# Patient Record
Sex: Female | Born: 1985 | Race: White | Hispanic: No | Marital: Married | Smoking: Never smoker
Health system: Southern US, Community
[De-identification: ages and names within clinical notes are randomized; demographics above are authoritative.]

## PROBLEM LIST (undated history)

## (undated) ENCOUNTER — Inpatient Hospital Stay (HOSPITAL_COMMUNITY): Payer: Self-pay

## (undated) DIAGNOSIS — N390 Urinary tract infection, site not specified: Secondary | ICD-10-CM

## (undated) DIAGNOSIS — O26899 Other specified pregnancy related conditions, unspecified trimester: Secondary | ICD-10-CM

## (undated) DIAGNOSIS — R12 Heartburn: Secondary | ICD-10-CM

## (undated) DIAGNOSIS — B373 Candidiasis of vulva and vagina: Secondary | ICD-10-CM

## (undated) DIAGNOSIS — B3731 Acute candidiasis of vulva and vagina: Secondary | ICD-10-CM

## (undated) DIAGNOSIS — Z789 Other specified health status: Secondary | ICD-10-CM

## (undated) HISTORY — PX: NO PAST SURGERIES: SHX2092

## (undated) HISTORY — DX: Acute candidiasis of vulva and vagina: B37.31

## (undated) HISTORY — DX: Urinary tract infection, site not specified: N39.0

## (undated) HISTORY — DX: Candidiasis of vulva and vagina: B37.3

## (undated) HISTORY — PX: WISDOM TOOTH EXTRACTION: SHX21

---

## 2011-04-28 ENCOUNTER — Encounter: Payer: Self-pay | Admitting: *Deleted

## 2011-04-28 ENCOUNTER — Ambulatory Visit (INDEPENDENT_AMBULATORY_CARE_PROVIDER_SITE_OTHER): Payer: BC Managed Care – PPO | Admitting: *Deleted

## 2011-04-28 VITALS — BP 134/73 | Temp 98.6°F | Ht 71.0 in | Wt 180.0 lb

## 2011-04-28 DIAGNOSIS — Z3480 Encounter for supervision of other normal pregnancy, unspecified trimester: Secondary | ICD-10-CM

## 2011-04-28 NOTE — Progress Notes (Signed)
Pt's IUP today showed CRL 5.31mm GA [redacted]w[redacted]d FHT 123bpm

## 2011-04-28 NOTE — Progress Notes (Signed)
p-72  This is a NOB intake for this G3P1A1.  She is the sister in law of Brent General who delivered with [place] 3 years ago.  Her last LMP is 4 weeks off from the U/S today.  She did say that she had one day of spotting in Feb which would correlate with the u/s.  PN labs drawn today and pt will return in 2 weeks for exam.  Her last pap was 6/12.  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

## 2011-04-29 LAB — OBSTETRIC PANEL
Basophils Absolute: 0 10*3/uL (ref 0.0–0.1)
Basophils Relative: 0 % (ref 0–1)
Eosinophils Absolute: 0.2 10*3/uL (ref 0.0–0.7)
Eosinophils Relative: 1 % (ref 0–5)
Hepatitis B Surface Ag: NEGATIVE
MCH: 30.1 pg (ref 26.0–34.0)
MCHC: 33.4 g/dL (ref 30.0–36.0)
Neutrophils Relative %: 66 % (ref 43–77)
Platelets: 183 10*3/uL (ref 150–400)
RBC: 4.45 MIL/uL (ref 3.87–5.11)
RDW: 12.9 % (ref 11.5–15.5)

## 2011-04-29 LAB — HIV ANTIBODY (ROUTINE TESTING W REFLEX): HIV: NONREACTIVE

## 2011-05-01 LAB — CULTURE, URINE COMPREHENSIVE: Colony Count: 1000

## 2011-05-12 ENCOUNTER — Ambulatory Visit (INDEPENDENT_AMBULATORY_CARE_PROVIDER_SITE_OTHER): Payer: BC Managed Care – PPO | Admitting: Obstetrics and Gynecology

## 2011-05-12 ENCOUNTER — Encounter: Payer: Self-pay | Admitting: Obstetrics and Gynecology

## 2011-05-12 ENCOUNTER — Other Ambulatory Visit: Payer: Self-pay | Admitting: Obstetrics and Gynecology

## 2011-05-12 VITALS — BP 121/78 | Temp 98.6°F | Wt 180.0 lb

## 2011-05-12 DIAGNOSIS — O219 Vomiting of pregnancy, unspecified: Secondary | ICD-10-CM

## 2011-05-12 DIAGNOSIS — Z124 Encounter for screening for malignant neoplasm of cervix: Secondary | ICD-10-CM

## 2011-05-12 DIAGNOSIS — Z113 Encounter for screening for infections with a predominantly sexual mode of transmission: Secondary | ICD-10-CM

## 2011-05-12 DIAGNOSIS — Z3480 Encounter for supervision of other normal pregnancy, unspecified trimester: Secondary | ICD-10-CM | POA: Insufficient documentation

## 2011-05-12 DIAGNOSIS — Z1151 Encounter for screening for human papillomavirus (HPV): Secondary | ICD-10-CM

## 2011-05-12 MED ORDER — ONDANSETRON 4 MG PO TBDP: 4.0000 mg | ORAL_TABLET | Freq: Three times a day (TID) | ORAL | Status: AC | PRN

## 2011-05-12 NOTE — Progress Notes (Signed)
p-68  Pt is requesting something for nausea

## 2011-05-12 NOTE — Patient Instructions (Signed)
Pregnancy - First Trimester  During sexual intercourse, millions of sperm go into the vagina. Only 1 sperm will penetrate and fertilize the female egg while it is in the Fallopian tube. One week later, the fertilized egg implants into the wall of the uterus. An embryo begins to develop into a baby. At 6 to 8 weeks, the eyes and face are formed and the heartbeat can be seen on ultrasound. At the end of 12 weeks (first trimester), all the baby's organs are formed. Now that you are pregnant, you will want to do everything you can to have a healthy baby. Two of the most important things are to get good prenatal care and follow your caregiver's instructions. Prenatal care is all the medical care you receive before the baby's birth. It is given to prevent, find, and treat problems during the pregnancy and childbirth.  PRENATAL EXAMS   During prenatal visits, your weight, blood pressure and urine are checked. This is done to make sure you are healthy and progressing normally during the pregnancy.   A pregnant woman should gain 25 to 35 pounds during the pregnancy. However, if you are over weight or underweight, your caregiver will advise you regarding your weight.   Your caregiver will ask and answer questions for you.   Blood work, cervical cultures, other necessary tests and a Pap test are done during your prenatal exams. These tests are done to check on your health and the probable health of your baby. Tests are strongly recommended and done for HIV with your permission. This is the virus that causes AIDS. These tests are done because medications can be given to help prevent your baby from being born with this infection should you have been infected without knowing it. Blood work is also used to find out your blood type, previous infections and follow your blood levels (hemoglobin).   Low hemoglobin (anemia) is common during pregnancy. Iron and vitamins are given to help prevent this. Later in the pregnancy, blood  tests for diabetes will be done along with any other tests if any problems develop. You may need tests to make sure you and the baby are doing well.   You may need other tests to make sure you and the baby are doing well.  CHANGES DURING THE FIRST TRIMESTER (THE FIRST 3 MONTHS OF PREGNANCY)  Your body goes through many changes during pregnancy. They vary from person to person. Talk to your caregiver about changes you notice and are concerned about. Changes can include:   Your menstrual period stops.   The egg and sperm carry the genes that determine what you look like. Genes from you and your partner are forming a baby. The female genes determine whether the baby is a boy or a girl.   Your body increases in girth and you may feel bloated.   Feeling sick to your stomach (nauseous) and throwing up (vomiting). If the vomiting is uncontrollable, call your caregiver.   Your breasts will begin to enlarge and become tender.   Your nipples may stick out more and become darker.   The need to urinate more. Painful urination may mean you have a bladder infection.   Tiring easily.   Loss of appetite.   Cravings for certain kinds of food.   At first, you may gain or lose a couple of pounds.   You may have changes in your emotions from day to day (excited to be pregnant or concerned something may go wrong with   the pregnancy and baby).   You may have more vivid and strange dreams.  HOME CARE INSTRUCTIONS    It is very important to avoid all smoking, alcohol and un-prescribed drugs during your pregnancy. These affect the formation and growth of the baby. Avoid chemicals while pregnant to ensure the delivery of a healthy infant.   Start your prenatal visits by the 12th week of pregnancy. They are usually scheduled monthly at first, then more often in the last 2 months before delivery. Keep your caregiver's appointments. Follow your caregiver's instructions regarding medication use, blood and lab tests, exercise, and  diet.   During pregnancy, you are providing food for you and your baby. Eat regular, well-balanced meals. Choose foods such as meat, fish, milk and other low fat dairy products, vegetables, fruits, and whole-grain breads and cereals. Your caregiver will tell you of the ideal weight gain.   You can help morning sickness by keeping soda crackers at the bedside. Eat a couple before arising in the morning. You may want to use the crackers without salt on them.   Eating 4 to 5 small meals rather than 3 large meals a day also may help the nausea and vomiting.   Drinking liquids between meals instead of during meals also seems to help nausea and vomiting.   A physical sexual relationship may be continued throughout pregnancy if there are no other problems. Problems may be early (premature) leaking of amniotic fluid from the membranes, vaginal bleeding, or belly (abdominal) pain.   Exercise regularly if there are no restrictions. Check with your caregiver or physical therapist if you are unsure of the safety of some of your exercises. Greater weight gain will occur in the last 2 trimesters of pregnancy. Exercising will help:   Control your weight.   Keep you in shape.   Prepare you for labor and delivery.   Help you lose your pregnancy weight after you deliver your baby.   Wear a good support or jogging bra for breast tenderness during pregnancy. This may help if worn during sleep too.   Ask when prenatal classes are available. Begin classes when they are offered.   Do not use hot tubs, steam rooms or saunas.   Wear your seat belt when driving. This protects you and your baby if you are in an accident.   Avoid raw meat, uncooked cheese, cat litter boxes and soil used by cats throughout the pregnancy. These carry germs that can cause birth defects in the baby.   The first trimester is a good time to visit your dentist for your dental health. Getting your teeth cleaned is OK. Use a softer toothbrush and brush  gently during pregnancy.   Ask for help if you have financial, counseling or nutritional needs during pregnancy. Your caregiver will be able to offer counseling for these needs as well as refer you for other special needs.   Do not take any medications or herbs unless told by your caregiver.   Inform your caregiver if there is any mental or physical domestic violence.   Make a list of emergency phone numbers of family, friends, hospital, and police and fire departments.   Write down your questions. Take them to your prenatal visit.   Do not douche.   Do not cross your legs.   If you have to stand for long periods of time, rotate you feet or take small steps in a circle.   You may have more vaginal secretions that may   require a sanitary pad. Do not use tampons or scented sanitary pads.  MEDICATIONS AND DRUG USE IN PREGNANCY   Take prenatal vitamins as directed. The vitamin should contain 1 milligram of folic acid. Keep all vitamins out of reach of children. Only a couple vitamins or tablets containing iron may be fatal to a baby or young child when ingested.   Avoid use of all medications, including herbs, over-the-counter medications, not prescribed or suggested by your caregiver. Only take over-the-counter or prescription medicines for pain, discomfort, or fever as directed by your caregiver. Do not use aspirin, ibuprofen, or naproxen unless directed by your caregiver.   Let your caregiver also know about herbs you may be using.   Alcohol is related to a number of birth defects. This includes fetal alcohol syndrome. All alcohol, in any form, should be avoided completely. Smoking will cause low birth rate and premature babies.   Street or illegal drugs are very harmful to the baby. They are absolutely forbidden. A baby born to an addicted mother will be addicted at birth. The baby will go through the same withdrawal an adult does.   Let your caregiver know about any medications that you have to take  and for what reason you take them.  MISCARRIAGE IS COMMON DURING PREGNANCY  A miscarriage does not mean you did something wrong. It is not a reason to worry about getting pregnant again. Your caregiver will help you with questions you may have. If you have a miscarriage, you may need minor surgery.  SEEK MEDICAL CARE IF:   You have any concerns or worries during your pregnancy. It is better to call with your questions if you feel they cannot wait, rather than worry about them.  SEEK IMMEDIATE MEDICAL CARE IF:    An unexplained oral temperature above 102 F (38.9 C) develops, or as your caregiver suggests.   You have leaking of fluid from the vagina (birth canal). If leaking membranes are suspected, take your temperature and inform your caregiver of this when you call.   There is vaginal spotting or bleeding. Notify your caregiver of the amount and how many pads are used.   You develop a bad smelling vaginal discharge with a change in the color.   You continue to feel sick to your stomach (nauseated) and have no relief from remedies suggested. You vomit blood or coffee ground-like materials.   You lose more than 2 pounds of weight in 1 week.   You gain more than 2 pounds of weight in 1 week and you notice swelling of your face, hands, feet, or legs.   You gain 5 pounds or more in 1 week (even if you do not have swelling of your hands, face, legs, or feet).   You get exposed to German measles and have never had them.   You are exposed to fifth disease or chickenpox.   You develop belly (abdominal) pain. Round ligament discomfort is a common non-cancerous (benign) cause of abdominal pain in pregnancy. Your caregiver still must evaluate this.   You develop headache, fever, diarrhea, pain with urination, or shortness of breath.   You fall or are in a car accident or have any kind of trauma.   There is mental or physical violence in your home.  Document Released: 12/24/2000 Document Revised: 12/19/2010  Document Reviewed: 06/27/2008  [employer] Patient Information 2012 [employer]

## 2011-05-14 NOTE — Progress Notes (Signed)
   Subjective:    Angie Green is a G3P1011 [redacted]w[redacted]d being seen today for her first obstetrical visit.  Her obstetrical history is significant for no complications. Patient does intend to breast feed. Pregnancy history fully reviewed.  Patient reports nausea and vomiting.  Filed Vitals:   05/12/11 0954  BP: 121/78  Temp: 98.6 F (37 C)  Weight: 180 lb (81.647 kg)    HISTORY: OB History    Grav Para Term Preterm Abortions TAB SAB Ect Mult Living   3 1 1  1  1   1      # Outc Date GA Lbr Len/2nd Wgt Sex Del Anes PTL Lv   1 SAB 3/06           2 TRM 5/07 [redacted]w[redacted]d   M SVD EPI     3 CUR              Past Medical History  Diagnosis Date  . Vaginal yeast infection     recurrent  . UTI (lower urinary tract infection)     recurrent  . PONV (postoperative nausea and vomiting)    Past Surgical History  Procedure Date  . Wisdom tooth extraction    Family History  Problem Relation Age of Onset  . Diabetes Maternal Grandfather   . Diabetes Paternal Grandmother   . Cancer Father     leukemia  . Cancer Father     rectal  . Cancer Maternal Grandfather     bladder  . Heart attack Maternal Grandfather      Exam    Uterus:     Pelvic Exam:    Perineum: No Hemorrhoids   Vulva: normal, Bartholin's, Urethra, Skene's normal   Vagina:  normal mucosa, normal discharge       Cervix: multiparous appearance   Adnexa: normal adnexa   Bony Pelvis: average  System: Breast:  Inspection negative   Skin: normal coloration and turgor, no rashes    Neurologic: oriented, normal   Extremities: normal strength, tone, and muscle mass   HEENT PERRLA   Mouth/Teeth mucous membranes moist, pharynx normal without lesions and dental hygiene good   Neck supple and no thyromegaly   Cardiovascular: regular rate and rhythm   Respiratory:  appears well, vitals normal, no respiratory distress, acyanotic, normal RR, ear and throat exam is normal, neck free of mass or lymphadenopathy, chest clear, no  wheezing, crepitations, rhonchi, normal symmetric air entry   Abdomen: soft, nontender   Urinary: urethral meatus normal      Assessment:    Pregnancy: [id number] Patient Active Problem List  Diagnoses  . Supervision of normal subsequent pregnancy    nausea/vomiting of pregnancy    Plan:     Initial labs reviewed  Pap, GC, Chlamydia sent Prenatal vitamins. Rx Zofran Problem list reviewed and updated. Genetic Screening discussed Integrated Screen: undecided.  Ultrasound discussed; fetal survey: requested.  Follow up in 4 weeks. 50% of 30 min visit spent on counseling and coordination of care.     Marlene Pfluger 05/14/2011

## 2011-06-04 ENCOUNTER — Encounter: Payer: Self-pay | Admitting: Obstetrics & Gynecology

## 2011-06-04 ENCOUNTER — Ambulatory Visit (INDEPENDENT_AMBULATORY_CARE_PROVIDER_SITE_OTHER): Payer: BC Managed Care – PPO | Admitting: Obstetrics & Gynecology

## 2011-06-04 VITALS — BP 127/69 | Temp 97.2°F | Wt 181.0 lb

## 2011-06-04 DIAGNOSIS — Z3480 Encounter for supervision of other normal pregnancy, unspecified trimester: Secondary | ICD-10-CM

## 2011-06-04 DIAGNOSIS — R5383 Other fatigue: Secondary | ICD-10-CM

## 2011-06-04 DIAGNOSIS — R5381 Other malaise: Secondary | ICD-10-CM

## 2011-06-04 LAB — CBC
MCH: 30.3 pg (ref 26.0–34.0)
MCHC: 33.7 g/dL (ref 30.0–36.0)
MCV: 89.9 fL (ref 78.0–100.0)
Platelets: 152 10*3/uL (ref 150–400)

## 2011-06-04 NOTE — Progress Notes (Signed)
Work in visit. Complains of SOB/fatigue/palpitations yesterday, improved some today. Denies other symptoms. Heart- rrr without m,r,g, Lungs- CTAB, pulse 70. I will check a TSH and CBC today. If her symptoms are still present at there scheduled office visit next Friday, I would recommend a cardiology referral. She is scheduled to get her First Trimester screen this week with [facility].

## 2011-06-04 NOTE — Progress Notes (Signed)
p-75  Episodes of SOB and heart palpitations..  Extreme fatigue  Going out of town and just wanted to be checked.  States it feels like asthma but denies any h/o

## 2011-06-06 ENCOUNTER — Other Ambulatory Visit (HOSPITAL_COMMUNITY): Payer: BC Managed Care – PPO

## 2011-06-10 ENCOUNTER — Ambulatory Visit (HOSPITAL_COMMUNITY)
Admission: RE | Admit: 2011-06-10 | Discharge: 2011-06-10 | Disposition: A | Discharge status: Home / Self Care | Payer: BC Managed Care – PPO | Source: Ambulatory Visit | Attending: Obstetrics and Gynecology | Admitting: Obstetrics and Gynecology

## 2011-06-10 VITALS — BP 121/62 | HR 60 | Wt 182.0 lb

## 2011-06-10 DIAGNOSIS — Z3689 Encounter for other specified antenatal screening: Secondary | ICD-10-CM | POA: Insufficient documentation

## 2011-06-10 DIAGNOSIS — Z3480 Encounter for supervision of other normal pregnancy, unspecified trimester: Secondary | ICD-10-CM

## 2011-06-10 DIAGNOSIS — O351XX0 Maternal care for (suspected) chromosomal abnormality in fetus, not applicable or unspecified: Secondary | ICD-10-CM | POA: Insufficient documentation

## 2011-06-10 DIAGNOSIS — O219 Vomiting of pregnancy, unspecified: Secondary | ICD-10-CM

## 2011-06-10 DIAGNOSIS — O3510X0 Maternal care for (suspected) chromosomal abnormality in fetus, unspecified, not applicable or unspecified: Secondary | ICD-10-CM | POA: Insufficient documentation

## 2011-06-10 NOTE — Progress Notes (Signed)
Patient seen today  For ultrasound.  See full report in [facility].  Alpha Gula, MD  Single IUP at 12 4/7 weeks First trimester screen performed - an NT of 1.2 mm was noted A nasal bone was appreciated  Recommend anatomy scan at [redacted] weeks gestation which can be scheduled here if desired.  Please contact us if we can be of further assistance.

## 2011-06-11 ENCOUNTER — Encounter: Payer: BC Managed Care – PPO | Admitting: Obstetrics & Gynecology

## 2011-06-12 ENCOUNTER — Other Ambulatory Visit: Payer: Self-pay

## 2011-06-13 ENCOUNTER — Encounter: Payer: Self-pay | Admitting: Family

## 2011-06-13 ENCOUNTER — Ambulatory Visit (INDEPENDENT_AMBULATORY_CARE_PROVIDER_SITE_OTHER): Payer: BC Managed Care – PPO | Admitting: Family

## 2011-06-13 VITALS — BP 109/69 | Temp 98.2°F | Wt 180.0 lb

## 2011-06-13 DIAGNOSIS — Z3480 Encounter for supervision of other normal pregnancy, unspecified trimester: Secondary | ICD-10-CM

## 2011-06-13 NOTE — Progress Notes (Signed)
p-72  Had nose bleed on Tues., has bad taste in mouth, no matter what she does to brush or floss.  Having fewer episodes of palpitations.

## 2011-06-13 NOTE — Progress Notes (Signed)
Considering water birth, discussed class; son is 6 and wants a brother; nose bleed with resolution, explained common in pregnancy; anatomy ultrasound scheduled for 07/21/11 (wedding anniversary)

## 2011-06-16 ENCOUNTER — Encounter: Payer: Self-pay | Admitting: Obstetrics & Gynecology

## 2011-06-17 ENCOUNTER — Encounter: Payer: Self-pay | Admitting: Obstetrics & Gynecology

## 2011-07-11 ENCOUNTER — Ambulatory Visit (INDEPENDENT_AMBULATORY_CARE_PROVIDER_SITE_OTHER): Payer: BC Managed Care – PPO | Admitting: Advanced Practice Midwife

## 2011-07-11 VITALS — BP 124/76 | Temp 98.4°F | Wt 184.0 lb

## 2011-07-11 DIAGNOSIS — Z3480 Encounter for supervision of other normal pregnancy, unspecified trimester: Secondary | ICD-10-CM

## 2011-07-11 DIAGNOSIS — Z3490 Encounter for supervision of normal pregnancy, unspecified, unspecified trimester: Secondary | ICD-10-CM

## 2011-07-11 NOTE — Progress Notes (Signed)
Feels well.  Scheduled for [place] next week.

## 2011-07-11 NOTE — Patient Instructions (Addendum)
Pregnancy - Second Trimester The second trimester of pregnancy (3 to 6 months) is a period of rapid growth for you and your baby. At the end of the sixth month, your baby is about 9 inches long and weighs 1 1/2 pounds. You will begin to feel the baby move between 18 and 20 weeks of the pregnancy. This is called quickening. Weight gain is faster. A clear fluid (colostrum) may leak out of your breasts. You may feel small contractions of the womb (uterus). This is known as false labor or Braxton-Hicks contractions. This is like a practice for labor when the baby is ready to be born. Usually, the problems with morning sickness have usually passed by the end of your first trimester. Some women develop small dark blotches (called cholasma, mask of pregnancy) on their face that usually goes away after the baby is born. Exposure to the sun makes the blotches worse. Acne may also develop in some pregnant women and pregnant women who have acne, may find that it goes away. PRENATAL EXAMS  Blood work may continue to be done during prenatal exams. These tests are done to check on your health and the probable health of your baby. Blood work is used to follow your blood levels (hemoglobin). Anemia (low hemoglobin) is common during pregnancy. Iron and vitamins are given to help prevent this. You will also be checked for diabetes between 24 and 28 weeks of the pregnancy. Some of the previous blood tests may be repeated.   The size of the uterus is measured during each visit. This is to make sure that the baby is continuing to grow properly according to the dates of the pregnancy.   Your blood pressure is checked every prenatal visit. This is to make sure you are not getting toxemia.   Your urine is checked to make sure you do not have an infection, diabetes or protein in the urine.   Your weight is checked often to make sure gains are happening at the suggested rate. This is to ensure that both you and your baby are  growing normally.   Sometimes, an ultrasound is performed to confirm the proper growth and development of the baby. This is a test which bounces harmless sound waves off the baby so your caregiver can more accurately determine due dates.  Sometimes, a specialized test is done on the amniotic fluid surrounding the baby. This test is called an amniocentesis. The amniotic fluid is obtained by sticking a needle into the belly (abdomen). This is done to check the chromosomes in instances where there is a concern about possible genetic problems with the baby. It is also sometimes done near the end of pregnancy if an early delivery is required. In this case, it is done to help make sure the baby's lungs are mature enough for the baby to live outside of the womb. CHANGES OCCURING IN THE SECOND TRIMESTER OF PREGNANCY Your body goes through many changes during pregnancy. They vary from person to person. Talk to your caregiver about changes you notice that you are concerned about.  During the second trimester, you will likely have an increase in your appetite. It is normal to have cravings for certain foods. This varies from person to person and pregnancy to pregnancy.   Your lower abdomen will begin to bulge.   You may have to urinate more often because the uterus and baby are pressing on your bladder. It is also common to get more bladder infections during pregnancy (  pain with urination). You can help this by drinking lots of fluids and emptying your bladder before and after intercourse.   You may begin to get stretch marks on your hips, abdomen, and breasts. These are normal changes in the body during pregnancy. There are no exercises or medications to take that prevent this change.   You may begin to develop swollen and bulging veins (varicose veins) in your legs. Wearing support hose, elevating your feet for 15 minutes, 3 to 4 times a day and limiting salt in your diet helps lessen the problem.    Heartburn may develop as the uterus grows and pushes up against the stomach. Antacids recommended by your caregiver helps with this problem. Also, eating smaller meals 4 to 5 times a day helps.   Constipation can be treated with a stool softener or adding bulk to your diet. Drinking lots of fluids, vegetables, fruits, and whole grains are helpful.   Exercising is also helpful. If you have been very active up until your pregnancy, most of these activities can be continued during your pregnancy. If you have been less active, it is helpful to start an exercise program such as walking.   Hemorrhoids (varicose veins in the rectum) may develop at the end of the second trimester. Warm sitz baths and hemorrhoid cream recommended by your caregiver helps hemorrhoid problems.   Backaches may develop during this time of your pregnancy. Avoid heavy lifting, wear low heal shoes and practice good posture to help with backache problems.   Some pregnant women develop tingling and numbness of their hand and fingers because of swelling and tightening of ligaments in the wrist (carpel tunnel syndrome). This goes away after the baby is born.   As your breasts enlarge, you may have to get a bigger bra. Get a comfortable, cotton, support bra. Do not get a nursing bra until the last month of the pregnancy if you will be nursing the baby.   You may get a dark line from your belly button to the pubic area called the linea nigra.   You may develop rosy cheeks because of increase blood flow to the face.   You may develop spider looking lines of the face, neck, arms and chest. These go away after the baby is born.  HOME CARE INSTRUCTIONS   It is extremely important to avoid all smoking, herbs, alcohol, and unprescribed drugs during your pregnancy. These chemicals affect the formation and growth of the baby. Avoid these chemicals throughout the pregnancy to ensure the delivery of a healthy infant.   Most of your home  care instructions are the same as suggested for the first trimester of your pregnancy. Keep your caregiver's appointments. Follow your caregiver's instructions regarding medication use, exercise and diet.   During pregnancy, you are providing food for you and your baby. Continue to eat regular, well-balanced meals. Choose foods such as meat, fish, milk and other low fat dairy products, vegetables, fruits, and whole-grain breads and cereals. Your caregiver will tell you of the ideal weight gain.   A physical sexual relationship may be continued up until near the end of pregnancy if there are no other problems. Problems could include early (premature) leaking of amniotic fluid from the membranes, vaginal bleeding, abdominal pain, or other medical or pregnancy problems.   Exercise regularly if there are no restrictions. Check with your caregiver if you are unsure of the safety of some of your exercises. The greatest weight gain will occur in the   last 2 trimesters of pregnancy. Exercise will help you:   Control your weight.   Get you in shape for labor and delivery.   Lose weight after you have the baby.   Wear a good support or jogging bra for breast tenderness during pregnancy. This may help if worn during sleep. Pads or tissues may be used in the bra if you are leaking colostrum.   Do not use hot tubs, steam rooms or saunas throughout the pregnancy.   Wear your seat belt at all times when driving. This protects you and your baby if you are in an accident.   Avoid raw meat, uncooked cheese, cat litter boxes and soil used by cats. These carry germs that can cause birth defects in the baby.   The second trimester is also a good time to visit your dentist for your dental health if this has not been done yet. Getting your teeth cleaned is OK. Use a soft toothbrush. Brush gently during pregnancy.   It is easier to loose urine during pregnancy. Tightening up and strengthening the pelvic muscles will  help with this problem. Practice stopping your urination while you are going to the bathroom. These are the same muscles you need to strengthen. It is also the muscles you would use as if you were trying to stop from passing gas. You can practice tightening these muscles up 10 times a set and repeating this about 3 times per day. Once you know what muscles to tighten up, do not perform these exercises during urination. It is more likely to contribute to an infection by backing up the urine.   Ask for help if you have financial, counseling or nutritional needs during pregnancy. Your caregiver will be able to offer counseling for these needs as well as refer you for other special needs.   Your skin may become oily. If so, wash your face with mild soap, use non-greasy moisturizer and oil or cream based makeup.  MEDICATIONS AND DRUG USE IN PREGNANCY  Take prenatal vitamins as directed. The vitamin should contain 1 milligram of folic acid. Keep all vitamins out of reach of children. Only a couple vitamins or tablets containing iron may be fatal to a baby or young child when ingested.   Avoid use of all medications, including herbs, over-the-counter medications, not prescribed or suggested by your caregiver. Only take over-the-counter or prescription medicines for pain, discomfort, or fever as directed by your caregiver. Do not use aspirin.   Let your caregiver also know about herbs you may be using.   Alcohol is related to a number of birth defects. This includes fetal alcohol syndrome. All alcohol, in any form, should be avoided completely. Smoking will cause low birth rate and premature babies.   Street or illegal drugs are very harmful to the baby. They are absolutely forbidden. A baby born to an addicted mother will be addicted at birth. The baby will go through the same withdrawal an adult does.  SEEK MEDICAL CARE IF:  You have any concerns or worries during your pregnancy. It is better to call with  your questions if you feel they cannot wait, rather than worry about them. SEEK IMMEDIATE MEDICAL CARE IF:   An unexplained oral temperature above 102 F (38.9 C) develops, or as your caregiver suggests.   You have leaking of fluid from the vagina (birth canal). If leaking membranes are suspected, take your temperature and tell your caregiver of this when you call.   There   is vaginal spotting, bleeding, or passing clots. Tell your caregiver of the amount and how many pads are used. Light spotting in pregnancy is common, especially following intercourse.   You develop a bad smelling vaginal discharge with a change in the color from clear to white.   You continue to feel sick to your stomach (nauseated) and have no relief from remedies suggested. You vomit blood or coffee ground-like materials.   You lose more than 2 pounds of weight or gain more than 2 pounds of weight over 1 week, or as suggested by your caregiver.   You notice swelling of your face, hands, feet, or legs.   You get exposed to German measles and have never had them.   You are exposed to fifth disease or chickenpox.   You develop belly (abdominal) pain. Round ligament discomfort is a common non-cancerous (benign) cause of abdominal pain in pregnancy. Your caregiver still must evaluate you.   You develop a bad headache that does not go away.   You develop fever, diarrhea, pain with urination, or shortness of breath.   You develop visual problems, blurry, or double vision.   You fall or are in a car accident or any kind of trauma.   There is mental or physical violence at home.  Document Released: 12/24/2000 Document Revised: 12/19/2010 Document Reviewed: 06/28/2008 [employer] Patient Information 2012 [employer]. 

## 2011-07-11 NOTE — Progress Notes (Signed)
p-77 

## 2011-07-15 ENCOUNTER — Encounter: Payer: Self-pay | Admitting: Advanced Practice Midwife

## 2011-07-21 ENCOUNTER — Other Ambulatory Visit (HOSPITAL_COMMUNITY): Payer: BC Managed Care – PPO

## 2011-07-21 ENCOUNTER — Ambulatory Visit (HOSPITAL_COMMUNITY)
Admission: RE | Admit: 2011-07-21 | Discharge: 2011-07-21 | Disposition: A | Discharge status: Home / Self Care | Payer: BC Managed Care – PPO | Source: Ambulatory Visit | Attending: Obstetrics and Gynecology | Admitting: Obstetrics and Gynecology

## 2011-07-21 VITALS — BP 112/55 | HR 61 | Wt 187.2 lb

## 2011-07-21 DIAGNOSIS — Z1389 Encounter for screening for other disorder: Secondary | ICD-10-CM | POA: Insufficient documentation

## 2011-07-21 DIAGNOSIS — Z363 Encounter for antenatal screening for malformations: Secondary | ICD-10-CM | POA: Insufficient documentation

## 2011-07-21 DIAGNOSIS — O4400 Placenta previa specified as without hemorrhage, unspecified trimester: Secondary | ICD-10-CM

## 2011-07-21 DIAGNOSIS — Z3480 Encounter for supervision of other normal pregnancy, unspecified trimester: Secondary | ICD-10-CM

## 2011-07-21 DIAGNOSIS — O358XX0 Maternal care for other (suspected) fetal abnormality and damage, not applicable or unspecified: Secondary | ICD-10-CM | POA: Insufficient documentation

## 2011-07-21 NOTE — Progress Notes (Signed)
Patient seen today  for ultrasound a[[pointment.  See full report in [facility].  Alpha Gula, MD  Single IUP at 18 3/7 weeks Normal detailed fetal anatomy No markers associated with aneuploidy noted A posterior placenta previa is appreciated Normal amniotic fluid volume  Recommend follow up ultrasound in 6 weeks to reevaluate placental location

## 2011-07-29 ENCOUNTER — Ambulatory Visit (INDEPENDENT_AMBULATORY_CARE_PROVIDER_SITE_OTHER): Payer: BC Managed Care – PPO | Admitting: Obstetrics & Gynecology

## 2011-07-29 VITALS — BP 130/71 | Temp 98.6°F | Wt 187.0 lb

## 2011-07-29 DIAGNOSIS — O0990 Supervision of high risk pregnancy, unspecified, unspecified trimester: Secondary | ICD-10-CM

## 2011-07-29 DIAGNOSIS — Z3480 Encounter for supervision of other normal pregnancy, unspecified trimester: Secondary | ICD-10-CM

## 2011-07-29 NOTE — Progress Notes (Signed)
Pt having pressure over pubic symphysis and in inguinal regions.  Pt has posterior placenta previa.  No bleeding or contractions.  No mass in inguinal region.  Urine clear.  Some tenderness over pubic symphysis.  Pt reassured.  No evidence of preterm labor.  Speculum exam shows nml cervix.  Bedside US shows long cervix and posterior previa.

## 2011-07-29 NOTE — Progress Notes (Signed)
p-71  White vag d/c but denies any itching or burning.

## 2011-07-30 ENCOUNTER — Encounter (HOSPITAL_COMMUNITY): Payer: Self-pay | Admitting: *Deleted

## 2011-07-30 ENCOUNTER — Inpatient Hospital Stay (HOSPITAL_COMMUNITY)
Admission: AD | Admit: 2011-07-30 | Discharge: 2011-07-30 | Disposition: A | Discharge status: Home / Self Care | Payer: BC Managed Care – PPO | Source: Ambulatory Visit | Attending: Obstetrics & Gynecology | Admitting: Obstetrics & Gynecology

## 2011-07-30 DIAGNOSIS — O4400 Placenta previa specified as without hemorrhage, unspecified trimester: Secondary | ICD-10-CM

## 2011-07-30 DIAGNOSIS — O4692 Antepartum hemorrhage, unspecified, second trimester: Secondary | ICD-10-CM

## 2011-07-30 DIAGNOSIS — O26899 Other specified pregnancy related conditions, unspecified trimester: Secondary | ICD-10-CM

## 2011-07-30 DIAGNOSIS — R102 Pelvic and perineal pain: Secondary | ICD-10-CM

## 2011-07-30 DIAGNOSIS — O4410 Placenta previa with hemorrhage, unspecified trimester: Secondary | ICD-10-CM | POA: Insufficient documentation

## 2011-07-30 DIAGNOSIS — D696 Thrombocytopenia, unspecified: Secondary | ICD-10-CM | POA: Insufficient documentation

## 2011-07-30 DIAGNOSIS — D689 Coagulation defect, unspecified: Secondary | ICD-10-CM | POA: Insufficient documentation

## 2011-07-30 DIAGNOSIS — O209 Hemorrhage in early pregnancy, unspecified: Secondary | ICD-10-CM | POA: Insufficient documentation

## 2011-07-30 LAB — CBC
HCT: 33.8 % — ABNORMAL LOW (ref 36.0–46.0)
Hemoglobin: 11.6 g/dL — ABNORMAL LOW (ref 12.0–15.0)
MCV: 89.4 fL (ref 78.0–100.0)
RBC: 3.78 MIL/uL — ABNORMAL LOW (ref 3.87–5.11)
RDW: 12.9 % (ref 11.5–15.5)
WBC: 9.9 10*3/uL (ref 4.0–10.5)

## 2011-07-30 LAB — ABO/RH: ABO/RH(D): O POS

## 2011-07-30 LAB — TYPE AND SCREEN: Antibody Screen: NEGATIVE

## 2011-07-30 NOTE — MAU Note (Signed)
Pt states she had some vaginal bleeding that started about 0130 . Pt states she had a vaginal exam at the [facility] in [place]

## 2011-07-30 NOTE — MAU Provider Note (Signed)
Angie Green y.o.G3P1011 @[redacted]w[redacted]d  by LMP Chief Complaint  Patient presents with  . Vaginal Bleeding     First Provider Initiated Contact with Patient 07/30/11 0246      SUBJECTIVE  HPI: Angie Green is a 26 y.o. year old G97P1011 female at [redacted]w[redacted]d weeks gestation who presents to MAU reporting vaginal bleeding since 0130. Know posterior previa. Describes bleeding as bright red w/ one ~4 cm clot, heavy enough to soak through her pajama pants and trickle down her legs as she walked to the bathroom. No active bleeding. Was seen in office yesterday by Dr. Penne Lash. Reported low abd/SP discomfort. Spec exam and trans-vag [place] per formed. Cervix long and closed. Denies contractions, LOF. No previous episodes of bleeding this pregnancy.   Past Medical History  Diagnosis Date  . Vaginal yeast infection     recurrent  . UTI (lower urinary tract infection)     recurrent   Past Surgical History  Procedure Date  . Wisdom tooth extraction    History   Social History  . Marital Status: Married    Spouse Name: N/A    Number of Children: N/A  . Years of Education: N/A   Occupational History  . adminstrative assist    Social History Main Topics  . Smoking status: Never Smoker   . Smokeless tobacco: Never Used  . Alcohol Use: Yes     very occassional  . Drug Use: No  . Sexually Active: Yes -- Female partner(s)   Other Topics Concern  . Not on file   Social History Narrative  . No narrative on file   No current facility-administered medications on file prior to encounter.   Current Outpatient Prescriptions on File Prior to Encounter  Medication Sig Dispense Refill  . ondansetron (ZOFRAN-ODT) 4 MG disintegrating tablet       . PRENATAL VITAMINS PO Take by mouth.       Allergies  Allergen Reactions  . Penicillins Rash    ROS: Pertinent items in HPI  OBJECTIVE BP 131/66  Pulse 76  Temp 98 F (36.7 C) (Oral)  LMP 02/12/2011  GENERAL: Well-developed, well-nourished female in  no acute distress. Anxious.  HEENT: Normocephalic, good dentition HEART: normal rate RESP: normal effort ABDOMEN: Soft, nontender EXTREMITIES: Nontender, no edema NEURO: Alert and oriented SPECULUM EXAM: deferred BIMANUAL: Deferred Scant BRB on pad. Dried blood on perineum.  FHR: 148 Toco: none  LAB RESULTS  Results for orders placed during the hospital encounter of 07/30/11 (from the past 24 hour(s))  CBC     Status: Abnormal   Collection Time   07/30/11  3:15 AM      Component Value Range   WBC 9.9  4.0 - 10.5 K/uL   RBC 3.78 (*) 3.87 - 5.11 MIL/uL   Hemoglobin 11.6 (*) 12.0 - 15.0 g/dL   HCT 45.4 (*) 09.8 - 11.9 %   MCV 89.4  78.0 - 100.0 fL   MCH 30.7  26.0 - 34.0 pg   MCHC 34.3  30.0 - 36.0 g/dL   RDW 14.7  82.9 - 56.2 %   Platelets 133 (*) 150 - 400 K/uL  TYPE AND SCREEN     Status: Normal   Collection Time   07/30/11  3:15 AM      Component Value Range   ABO/RH(D) O POS     Antibody Screen NEG     Sample Expiration 08/02/2011    ABO/RH     Status: Normal   Collection Time  07/30/11  3:15 AM      Component Value Range   ABO/RH(D) O POS      IMAGING NA  ED COURSE 0320: CBC, T&S, Obs x 2 hours. D/C home if no new bleeding. Consulted w/ Dr. Debroah Loop. Agrees w/ POC. 0500: Scant bleeding throughout MAU stay. Pt up to bathroom twice. Questions about intermittent sharp LLQ pains. None now. Mild tenderness. Denies constipation. No UC's per toco. Likely round ligament pains.   ASSESSMENT 1. Placenta previa antepartum   2. Thrombocytopenia   3. Second trimester bleeding   4. Pain of round ligament complicating pregnancy, antepartum    PLAN D/C home Bleeding precautions Stay out of work x 2 days Continue pelvic rest Medication List  As of 07/30/2011  5:12 AM   TAKE these medications         ondansetron 4 MG disintegrating tablet   Commonly known as: ZOFRAN-ODT      PRENATAL VITAMINS PO   Take by mouth.           Follow-up Information    Follow up with  [facility]. (as scheduled)    Contact information:   [address] [place] [address]       Follow up with [facility]. (As needed if symptoms worsen)    Contact information:   [address] [place] [address] [phone]        Dorathy Kinsman 07/30/2011 5:12 AM

## 2011-08-04 ENCOUNTER — Telehealth: Payer: Self-pay | Admitting: *Deleted

## 2011-08-04 NOTE — Telephone Encounter (Signed)
Pt notified of neg AFP.

## 2011-08-05 ENCOUNTER — Encounter: Payer: Self-pay | Admitting: Obstetrics & Gynecology

## 2011-08-07 ENCOUNTER — Encounter: Payer: Self-pay | Admitting: Obstetrics & Gynecology

## 2011-08-07 ENCOUNTER — Ambulatory Visit (INDEPENDENT_AMBULATORY_CARE_PROVIDER_SITE_OTHER): Payer: BC Managed Care – PPO | Admitting: Obstetrics & Gynecology

## 2011-08-07 VITALS — BP 121/71 | Wt 189.0 lb

## 2011-08-07 DIAGNOSIS — Z3480 Encounter for supervision of other normal pregnancy, unspecified trimester: Secondary | ICD-10-CM

## 2011-08-07 DIAGNOSIS — O4400 Placenta previa specified as without hemorrhage, unspecified trimester: Secondary | ICD-10-CM

## 2011-08-07 DIAGNOSIS — D696 Thrombocytopenia, unspecified: Secondary | ICD-10-CM

## 2011-08-07 NOTE — Progress Notes (Signed)
Follow visit. After her visit here last week she ended up having vaginal bleeding and spent several hours in the MAU. She was sent home and the blood became browner over the next day or so. She had an occasion of seeing blood tinged mucous several days ago. She is still on pelvic rest. She does complain of what sounds like Braxton-Hicks ctxs, so I will put her on the monitor. No contractions were seen.  I have given bleeding/pain precautions. Because her platelet count was 133K at her MAU visit, I will recheck them today. I used a small speculum and was unable to visualize the cervix but I saw no blood.

## 2011-08-07 NOTE — Progress Notes (Signed)
X 3 days having increased lower pelvic and lower back pain, yesterday she had increased blood tinged mucous, followed by brown discharge the following morning.  The baby is moving around good.  Unable to give urine sample at check in.

## 2011-08-19 ENCOUNTER — Telehealth: Payer: Self-pay | Admitting: *Deleted

## 2011-08-19 DIAGNOSIS — B850 Pediculosis due to Pediculus humanus capitis: Secondary | ICD-10-CM

## 2011-08-19 MED ORDER — BENZYL ALCOHOL 5 % EX LOTN: 1.0000 | TOPICAL_LOTION | Freq: Once | CUTANEOUS | Status: DC

## 2011-08-19 NOTE — Telephone Encounter (Signed)
Pt called stating that she has used RID for lice and it still isn't gone away.  Spoke with Dr Marice Potter who ordered Herbert Seta lotion to be applied to dry hair x 10 minutes then was as normal.  May repeat in 7 days.

## 2011-08-25 ENCOUNTER — Ambulatory Visit (INDEPENDENT_AMBULATORY_CARE_PROVIDER_SITE_OTHER): Payer: BC Managed Care – PPO | Admitting: Family

## 2011-08-25 DIAGNOSIS — Z3480 Encounter for supervision of other normal pregnancy, unspecified trimester: Secondary | ICD-10-CM

## 2011-08-25 NOTE — Progress Notes (Signed)
Having chest discomfort then moves to abdomen and back.  Still having problems with groin pain.

## 2011-08-25 NOTE — Progress Notes (Signed)
Reports a "hollow feeling" in chest before braxton hicks; no chest pain, explained the normalcy of this sensation as uterus presses up against diaphragm.  Report any chest pain or consistent difficulty in bleeding; no vaginal bleeding reported since last visit.  Next ultrasound scheduled for 09/03/11.

## 2011-09-02 ENCOUNTER — Encounter: Payer: Self-pay | Admitting: Obstetrics & Gynecology

## 2011-09-02 ENCOUNTER — Ambulatory Visit (INDEPENDENT_AMBULATORY_CARE_PROVIDER_SITE_OTHER): Payer: BC Managed Care – PPO | Admitting: Obstetrics & Gynecology

## 2011-09-02 DIAGNOSIS — Z3480 Encounter for supervision of other normal pregnancy, unspecified trimester: Secondary | ICD-10-CM

## 2011-09-02 NOTE — Progress Notes (Signed)
Unscheduled visit today. At about 0900, she had some clear fluid leak out. It did not continue to leak but she was concerned. Good FM. No VB or CTXs. She has her MFM appt tomorrow to reassess the placenta. Spec exam is negative for Valsalva, pool, fern, nitrazine. I did a bedside u/s that showed a normal AFI. She felt reassured. She will RTC in 2 weeks for her glucola/labs/tdap.

## 2011-09-02 NOTE — Progress Notes (Signed)
p-84  Panties wet and worried that she may have leaking

## 2011-09-03 ENCOUNTER — Other Ambulatory Visit (HOSPITAL_COMMUNITY): Payer: Self-pay | Admitting: Maternal and Fetal Medicine

## 2011-09-03 ENCOUNTER — Ambulatory Visit (HOSPITAL_COMMUNITY)
Admission: RE | Admit: 2011-09-03 | Discharge: 2011-09-03 | Disposition: A | Discharge status: Home / Self Care | Payer: BC Managed Care – PPO | Source: Ambulatory Visit | Attending: Obstetrics and Gynecology | Admitting: Obstetrics and Gynecology

## 2011-09-03 VITALS — BP 112/82 | HR 89 | Wt 192.8 lb

## 2011-09-03 DIAGNOSIS — O4400 Placenta previa specified as without hemorrhage, unspecified trimester: Secondary | ICD-10-CM

## 2011-09-03 DIAGNOSIS — Z3689 Encounter for other specified antenatal screening: Secondary | ICD-10-CM | POA: Insufficient documentation

## 2011-09-03 NOTE — Progress Notes (Signed)
Angie Green  was seen today for an ultrasound appointment.  See full report in [facility].  Alpha Gula, MD  Single IUP at 24 5/7 weeks Normal interval anatomy Interval growth is appropriate (54th %tile) Normal amniotic fluid volume  TVUS - marginal posterior placenta previa again noted - the placental edge comes right to the internal cervical os  Recommend follow up ultrasound in 4 weeks to reevaluate placental location

## 2011-09-04 ENCOUNTER — Telehealth: Payer: Self-pay | Admitting: *Deleted

## 2011-09-04 NOTE — Telephone Encounter (Signed)
Pt called stating that she feel"funny down there"  Like a tampon stuck.  She stated that she got a mirror and looked and it did not look normal to her.  She did have a vaginal exam by Dr Marice Potter on Tuesday and nothing was noted.  She also had [facility] and nothing anatomically was noted.  She does have a previa but not complete.  She had this feeling about 3 weeks ago and it went away.  Offered an appt but she want to see if it gets any better today and if not will be seen tomorrow.  Pt denies and itching, burning or abnormal discharge.

## 2011-09-22 ENCOUNTER — Ambulatory Visit: Payer: BC Managed Care – PPO | Admitting: Obstetrics and Gynecology

## 2011-09-26 ENCOUNTER — Ambulatory Visit (INDEPENDENT_AMBULATORY_CARE_PROVIDER_SITE_OTHER): Payer: BC Managed Care – PPO | Admitting: Family

## 2011-09-26 VITALS — BP 125/77 | Temp 98.0°F | Wt 198.0 lb

## 2011-09-26 DIAGNOSIS — Z3480 Encounter for supervision of other normal pregnancy, unspecified trimester: Secondary | ICD-10-CM

## 2011-09-26 LAB — CBC
MCH: 30.9 pg (ref 26.0–34.0)
MCHC: 34.5 g/dL (ref 30.0–36.0)
MCV: 89.7 fL (ref 78.0–100.0)
Platelets: 130 10*3/uL — ABNORMAL LOW (ref 150–400)
RBC: 3.88 MIL/uL (ref 3.87–5.11)

## 2011-09-26 NOTE — Progress Notes (Signed)
p-81  28 week labs today

## 2011-09-26 NOTE — Progress Notes (Signed)
No bleeding; repeat US on 9/16 to see if marginal previa resolved; irregular braxton hicks 2/day; decreased round ligament pain compared to last visit; obtained 1 hr, CBC (check platelets).

## 2011-09-27 LAB — RPR

## 2011-09-29 ENCOUNTER — Ambulatory Visit (HOSPITAL_COMMUNITY): Payer: BC Managed Care – PPO

## 2011-09-29 ENCOUNTER — Ambulatory Visit (HOSPITAL_COMMUNITY)
Admission: RE | Admit: 2011-09-29 | Discharge: 2011-09-29 | Disposition: A | Discharge status: Home / Self Care | Payer: BC Managed Care – PPO | Source: Ambulatory Visit | Attending: Obstetrics and Gynecology | Admitting: Obstetrics and Gynecology

## 2011-09-29 VITALS — BP 129/69 | HR 89 | Wt 199.0 lb

## 2011-09-29 DIAGNOSIS — O4400 Placenta previa specified as without hemorrhage, unspecified trimester: Secondary | ICD-10-CM

## 2011-09-29 DIAGNOSIS — Z3689 Encounter for other specified antenatal screening: Secondary | ICD-10-CM | POA: Insufficient documentation

## 2011-09-29 NOTE — Progress Notes (Signed)
Angie Green  was seen today for an ultrasound appointment.  See full report in [facility].  Alpha Gula, MD  Single IUP at 28 3/7 weeks Normal interval anatomy Interval growth is appropriate (64th %tile) Normal amniotic fluid volume  TVUS - marginal posterior placenta previa again noted - the placental edge 8 mm from internal os  Recommend follow up ultrasound in 4 weeks - will reassess placental location at that time

## 2011-09-30 LAB — GLUCOSE TOLERANCE, 1 HOUR

## 2011-10-03 ENCOUNTER — Encounter: Payer: Self-pay | Admitting: Family

## 2011-10-13 ENCOUNTER — Telehealth: Payer: Self-pay | Admitting: *Deleted

## 2011-10-13 ENCOUNTER — Ambulatory Visit (INDEPENDENT_AMBULATORY_CARE_PROVIDER_SITE_OTHER): Payer: BC Managed Care – PPO | Admitting: Obstetrics and Gynecology

## 2011-10-13 VITALS — BP 126/76 | Wt 200.0 lb

## 2011-10-13 DIAGNOSIS — Z3480 Encounter for supervision of other normal pregnancy, unspecified trimester: Secondary | ICD-10-CM

## 2011-10-13 DIAGNOSIS — B3731 Acute candidiasis of vulva and vagina: Secondary | ICD-10-CM

## 2011-10-13 DIAGNOSIS — B373 Candidiasis of vulva and vagina: Secondary | ICD-10-CM

## 2011-10-13 DIAGNOSIS — O99119 Other diseases of the blood and blood-forming organs and certain disorders involving the immune mechanism complicating pregnancy, unspecified trimester: Secondary | ICD-10-CM | POA: Insufficient documentation

## 2011-10-13 DIAGNOSIS — Z23 Encounter for immunization: Secondary | ICD-10-CM

## 2011-10-13 DIAGNOSIS — O4400 Placenta previa specified as without hemorrhage, unspecified trimester: Secondary | ICD-10-CM

## 2011-10-13 DIAGNOSIS — D696 Thrombocytopenia, unspecified: Secondary | ICD-10-CM

## 2011-10-13 MED ORDER — FLUCONAZOLE 150 MG PO TABS: 150.0000 mg | ORAL_TABLET | Freq: Once | ORAL | Status: DC

## 2011-10-13 MED ORDER — TETANUS-DIPHTH-ACELL PERTUSSIS 5-2.5-18.5 LF-MCG/0.5 IM SUSP: 0.5000 mL | Freq: Once | INTRAMUSCULAR | Status: DC

## 2011-10-13 NOTE — Telephone Encounter (Signed)
Pt called and stated that her RX for Diflucan was not @ her pharmacy.  The RX had been printed out here.  RX called to her pharmacy.

## 2011-10-13 NOTE — Patient Instructions (Signed)
Fetal Monitoring, Fetal Movement Assessment Fetal movement assessment (FMA) is done by the pregnant woman herself by counting and recording the baby's movements over a certain time period. It is done to see if there are problems with the pregnancy and the baby. Identifying and correcting problems may prevent serious problems from developing with the fetus, including fetal loss. Some pregnancies are complicated by the mother's medical problems. Some of these problems are type 1 diabetes mellitus, high blood pressure and other chronic medical illnesses. This is why it is important to monitor the baby before birth.  OTHER TECHNIQUES OF MONITORING YOUR BABY BEFORE BIRTH Several tests are in use. These include:  Nonstress test (NST). This test monitors the baby's heart rate when the baby moves.   Contraction stress test (CST). This test monitors the baby's heart rate during a contraction of the uterus.   Fetal biophysical profile (BPP). This measures and evaluates 5 observations of the baby:   The nonstress test.   The baby's breathing.   The baby's movements.   The baby's muscle tone.   The amount of amniotic fluid.   Modified BPP. This measures the volume of fluid in different parts of the amniotic sac (amniotic fluid index) and the results of the nonstress test.   Umbilical artery doppler velocimetry. This evaluates the blood flow through the umbilical cord.  There are several very serious problems that cannot be predicted or detected with any of the fetal monitoring procedures. These problems include separation (abruption) of the placenta or when the fetus chokes on the umbilical cord (umbilical cord accident).  Continue pelvic rest. Your caregiver will help you understand your tests and what they mean for you and your baby. It is your responsibility to obtain the results of your test. LET YOUR CAREGIVER KNOW ABOUT:   Any medications you are taking including prescription and  over-the-counter drugs, herbs, eye drops and creams.   If you have a fever.   If you have an infection.   If you are sick.  RISKS AND COMPLICATIONS  There are no risks or complications to the mother or fetus with FMA. BEFORE THE PROCEDURE  Do not take medications that may decrease or increase the baby's heart rate and/or movements.   Eat a full meal at least 2 hours before the test.   Do not smoke if you are pregnant. If you smoke, stop at least 2 days before the test. It is best not to smoke at all when you are pregnant.  PROCEDURE Sometimes, a mother notices her baby moves less before there are problems. Because of this, it is believed that fetal movement checking by the mother (kick counts) is a good way to check the baby before birth. There are different ways of doing this. Two good ways are:  The woman lies on her side and counts distinct (individual) fetal movements. A feeling of 10 distinct movements in a period of up to 2 hours is considered reassuring. When 10 movements are felt, you may stop counting.   Women are instructed to count fetal movements for 1 hour, three times per week. The count is good if, after one week, it equals or is over the woman's previously established baseline count. If the count is lower, further checking of your baby is needed.  AFTER THE PROCEDURE You may resume your usual activities. HOME CARE INSTRUCTIONS   Follow your caregiver's advice and recommendations.   Be aware of your baby's movements. Are they normal, less than usual  or more than usual?   Make and keep the rest of your prenatal appointments.  SEEK MEDICAL CARE IF:   You develop a temperature of 100 F (37.8 C) or higher.   You have a bloody mucus discharge from the vagina (a bloody show).  SEEK IMMEDIATE MEDICAL CARE IF:   You do not feel the baby move.   You think the baby's movements are too little or too many.   You develop contractions.   You develop vaginal bleeding.     You have belly (abdominal) pain.   You have leaking or a gush of fluid from the vagina.  Document Released: 12/20/2001 Document Revised: 12/19/2010 Document Reviewed: 04/24/2008 [facility] Patient Information 2012 [place], [place].

## 2011-10-13 NOTE — Progress Notes (Signed)
p-86  Some vaginal itching and discharge

## 2011-10-13 NOTE — Progress Notes (Signed)
[place] 9/16: marginal post previa. Plan rescan at 32 wks. Plts 130K. 1 hr 92. Interested in Systems developer. Discussed heartburn and will go back to [facility] which worked before. Has pruritic white vaginal discharge -> Rx Diflucan. dESIRES btl.

## 2011-10-13 NOTE — Addendum Note (Signed)
Addended by: Mariel Aloe L on: 10/13/2011 12:00 PM   Modules accepted: Orders

## 2011-10-20 ENCOUNTER — Ambulatory Visit (INDEPENDENT_AMBULATORY_CARE_PROVIDER_SITE_OTHER): Payer: BC Managed Care – PPO | Admitting: Advanced Practice Midwife

## 2011-10-20 ENCOUNTER — Encounter: Payer: Self-pay | Admitting: Advanced Practice Midwife

## 2011-10-20 DIAGNOSIS — N898 Other specified noninflammatory disorders of vagina: Secondary | ICD-10-CM

## 2011-10-20 NOTE — Progress Notes (Signed)
Presents with complaints of intermittent vaginal discharge which sometimes has odor, other times not. No itching or irritation. Wet mount done > normal epithelial cells, no clue cells or hyphae.

## 2011-10-20 NOTE — Patient Instructions (Signed)
Pregnancy - Third Trimester  The third trimester of pregnancy (the last 3 months) is a period of the most rapid growth for you and your baby. The baby approaches a length of 20 inches and a weight of 6 to 10 pounds. The baby is adding on fat and getting ready for life outside your body. While inside, babies have periods of sleeping and waking, suck their thumbs, and hiccups. You can often feel small contractions of the uterus. This is false labor. It is also called Braxton-Hicks contractions. This is like a practice for labor. The usual problems in this stage of pregnancy include more difficulty breathing, swelling of the hands and feet from water retention, and having to urinate more often because of the uterus and baby pressing on your bladder.   PRENATAL EXAMS  · Blood work may continue to be done during prenatal exams. These tests are done to check on your health and the probable health of your baby. Blood work is used to follow your blood levels (hemoglobin). Anemia (low hemoglobin) is common during pregnancy. Iron and vitamins are given to help prevent this. You may also continue to be checked for diabetes. Some of the past blood tests may be done again.  · The size of the uterus is measured during each visit. This makes sure your baby is growing properly according to your pregnancy dates.  · Your blood pressure is checked every prenatal visit. This is to make sure you are not getting toxemia.  · Your urine is checked every prenatal visit for infection, diabetes and protein.  · Your weight is checked at each visit. This is done to make sure gains are happening at the suggested rate and that you and your baby are growing normally.  · Sometimes, an ultrasound is performed to confirm the position and the proper growth and development of the baby. This is a test done that bounces harmless sound waves off the baby so your caregiver can more accurately determine due dates.  · Discuss the type of pain medication and  anesthesia you will have during your labor and delivery.  · Discuss the possibility and anesthesia if a Cesarean Section might be necessary.  · Inform your caregiver if there is any mental or physical violence at home.  Sometimes, a specialized non-stress test, contraction stress test and biophysical profile are done to make sure the baby is not having a problem. Checking the amniotic fluid surrounding the baby is called an amniocentesis. The amniotic fluid is removed by sticking a needle into the belly (abdomen). This is sometimes done near the end of pregnancy if an early delivery is required. In this case, it is done to help make sure the baby's lungs are mature enough for the baby to live outside of the womb. If the lungs are not mature and it is unsafe to deliver the baby, an injection of cortisone medication is given to the mother 1 to 2 days before the delivery. This helps the baby's lungs mature and makes it safer to deliver the baby.  CHANGES OCCURING IN THE THIRD TRIMESTER OF PREGNANCY  Your body goes through many changes during pregnancy. They vary from person to person. Talk to your caregiver about changes you notice and are concerned about.  · During the last trimester, you have probably had an increase in your appetite. It is normal to have cravings for certain foods. This varies from person to person and pregnancy to pregnancy.  · You may begin to   get stretch marks on your hips, abdomen, and breasts. These are normal changes in the body during pregnancy. There are no exercises or medications to take which prevent this change.  · Constipation may be treated with a stool softener or adding bulk to your diet. Drinking lots of fluids, fiber in vegetables, fruits, and whole grains are helpful.  · Exercising is also helpful. If you have been very active up until your pregnancy, most of these activities can be continued during your pregnancy. If you have been less active, it is helpful to start an exercise  program such as walking. Consult your caregiver before starting exercise programs.  · Avoid all smoking, alcohol, un-prescribed drugs, herbs and "street drugs" during your pregnancy. These chemicals affect the formation and growth of the baby. Avoid chemicals throughout the pregnancy to ensure the delivery of a healthy infant.  · Backache, varicose veins and hemorrhoids may develop or get worse.  · You will tire more easily in the third trimester, which is normal.  · The baby's movements may be stronger and more often.  · You may become short of breath easily.  · Your belly button may stick out.  · A yellow discharge may leak from your breasts called colostrum.  · You may have a bloody mucus discharge. This usually occurs a few days to a week before labor begins.  HOME CARE INSTRUCTIONS   · Keep your caregiver's appointments. Follow your caregiver's instructions regarding medication use, exercise, and diet.  · During pregnancy, you are providing food for you and your baby. Continue to eat regular, well-balanced meals. Choose foods such as meat, fish, milk and other low fat dairy products, vegetables, fruits, and whole-grain breads and cereals. Your caregiver will tell you of the ideal weight gain.  · A physical sexual relationship may be continued throughout pregnancy if there are no other problems such as early (premature) leaking of amniotic fluid from the membranes, vaginal bleeding, or belly (abdominal) pain.  · Exercise regularly if there are no restrictions. Check with your caregiver if you are unsure of the safety of your exercises. Greater weight gain will occur in the last 2 trimesters of pregnancy. Exercising helps:  · Control your weight.  · Get you in shape for labor and delivery.  · You lose weight after you deliver.  · Rest a lot with legs elevated, or as needed for leg cramps or low back pain.  · Wear a good support or jogging bra for breast tenderness during pregnancy. This may help if worn during  sleep. Pads or tissues may be used in the bra if you are leaking colostrum.  · Do not use hot tubs, steam rooms, or saunas.  · Wear your seat belt when driving. This protects you and your baby if you are in an accident.  · Avoid raw meat, cat litter boxes and soil used by cats. These carry germs that can cause birth defects in the baby.  · It is easier to loose urine during pregnancy. Tightening up and strengthening the pelvic muscles will help with this problem. You can practice stopping your urination while you are going to the bathroom. These are the same muscles you need to strengthen. It is also the muscles you would use if you were trying to stop from passing gas. You can practice tightening these muscles up 10 times a set and repeating this about 3 times per day. Once you know what muscles to tighten up, do not perform these   exercises during urination. It is more likely to cause an infection by backing up the urine.  · Ask for help if you have financial, counseling or nutritional needs during pregnancy. Your caregiver will be able to offer counseling for these needs as well as refer you for other special needs.  · Make a list of emergency phone numbers and have them available.  · Plan on getting help from family or friends when you go home from the hospital.  · Make a trial run to the hospital.  · Take prenatal classes with the father to understand, practice and ask questions about the labor and delivery.  · Prepare the baby's room/nursery.  · Do not travel out of the city unless it is absolutely necessary and with the advice of your caregiver.  · Wear only low or no heal shoes to have better balance and prevent falling.  MEDICATIONS AND DRUG USE IN PREGNANCY  · Take prenatal vitamins as directed. The vitamin should contain 1 milligram of folic acid. Keep all vitamins out of reach of children. Only a couple vitamins or tablets containing iron may be fatal to a baby or young child when ingested.  · Avoid use  of all medications, including herbs, over-the-counter medications, not prescribed or suggested by your caregiver. Only take over-the-counter or prescription medicines for pain, discomfort, or fever as directed by your caregiver. Do not use aspirin, ibuprofen (Motrin®, Advil®, Nuprin®) or naproxen (Aleve®) unless OK'd by your caregiver.  · Let your caregiver also know about herbs you may be using.  · Alcohol is related to a number of birth defects. This includes fetal alcohol syndrome. All alcohol, in any form, should be avoided completely. Smoking will cause low birth rate and premature babies.  · Street/illegal drugs are very harmful to the baby. They are absolutely forbidden. A baby born to an addicted mother will be addicted at birth. The baby will go through the same withdrawal an adult does.  SEEK MEDICAL CARE IF:  You have any concerns or worries during your pregnancy. It is better to call with your questions if you feel they cannot wait, rather than worry about them.  DECISIONS ABOUT CIRCUMCISION  You may or may not know the sex of your baby. If you know your baby is a boy, it may be time to think about circumcision. Circumcision is the removal of the foreskin of the penis. This is the skin that covers the sensitive end of the penis. There is no proven medical need for this. Often this decision is made on what is popular at the time or based upon religious beliefs and social issues. You can discuss these issues with your caregiver or pediatrician.  SEEK IMMEDIATE MEDICAL CARE IF:   · An unexplained oral temperature above 102° F (38.9° C) develops, or as your caregiver suggests.  · You have leaking of fluid from the vagina (birth canal). If leaking membranes are suspected, take your temperature and tell your caregiver of this when you call.  · There is vaginal spotting, bleeding or passing clots. Tell your caregiver of the amount and how many pads are used.  · You develop a bad smelling vaginal discharge with  a change in the color from clear to white.  · You develop vomiting that lasts more than 24 hours.  · You develop chills or fever.  · You develop shortness of breath.  · You develop burning on urination.  · You loose more than 2 pounds of weight   or gain more than 2 pounds of weight or as suggested by your caregiver.  · You notice sudden swelling of your face, hands, and feet or legs.  · You develop belly (abdominal) pain. Round ligament discomfort is a common non-cancerous (benign) cause of abdominal pain in pregnancy. Your caregiver still must evaluate you.  · You develop a severe headache that does not go away.  · You develop visual problems, blurred or double vision.  · If you have not felt your baby move for more than 1 hour. If you think the baby is not moving as much as usual, eat something with sugar in it and lie down on your left side for an hour. The baby should move at least 4 to 5 times per hour. Call right away if your baby moves less than that.  · You fall, are in a car accident or any kind of trauma.  · There is mental or physical violence at home.  Document Released: 12/24/2000 Document Revised: 03/24/2011 Document Reviewed: 06/28/2008  [employer]® Patient Information ©2013 [employer].

## 2011-10-20 NOTE — Progress Notes (Signed)
p-80 

## 2011-10-23 ENCOUNTER — Ambulatory Visit (HOSPITAL_COMMUNITY)
Admission: RE | Admit: 2011-10-23 | Discharge: 2011-10-23 | Disposition: A | Discharge status: Home / Self Care | Payer: BC Managed Care – PPO | Source: Ambulatory Visit | Attending: Obstetrics and Gynecology | Admitting: Obstetrics and Gynecology

## 2011-10-23 VITALS — BP 117/63 | HR 90 | Wt 204.5 lb

## 2011-10-23 DIAGNOSIS — O4400 Placenta previa specified as without hemorrhage, unspecified trimester: Secondary | ICD-10-CM | POA: Insufficient documentation

## 2011-10-23 DIAGNOSIS — Z3689 Encounter for other specified antenatal screening: Secondary | ICD-10-CM | POA: Insufficient documentation

## 2011-10-23 NOTE — Progress Notes (Signed)
Ms. Ruland had an ultrasound appointment today.  Please see [facility] report for details.  Comments There is an active singleton fetus with no apparent dysmorphic features on today's routine anatomic re-examination.  The biometry suggests a fetus with an EFW at the approximately 50th percentile for gestational age.    Impression Active singleton fetus. Normal interval growth. Normal amniotic fluid volume Low-lying placenta, measuring 1.1cm from internal os.  Recommendations 1. Repeat interval growth assessment by ultrasound was scheduled for your patient in 4 weeks. 2. Follow as clinically indicated.  Rogelia Boga, MD, MS, FACOG Assistant Professor Section of Maternal-Fetal Medicine [facility]

## 2011-10-27 ENCOUNTER — Inpatient Hospital Stay (HOSPITAL_COMMUNITY): Admission: RE | Admit: 2011-10-27 | Payer: BC Managed Care – PPO | Source: Ambulatory Visit

## 2011-11-07 ENCOUNTER — Ambulatory Visit (INDEPENDENT_AMBULATORY_CARE_PROVIDER_SITE_OTHER): Payer: BC Managed Care – PPO | Admitting: Family

## 2011-11-07 DIAGNOSIS — Z3480 Encounter for supervision of other normal pregnancy, unspecified trimester: Secondary | ICD-10-CM

## 2011-11-07 NOTE — Progress Notes (Signed)
p-79 

## 2011-11-07 NOTE — Progress Notes (Signed)
No reports of bleeding; next ultrasound scheduled for 11/7 to check for previa; if previa, would like to schedule on 12/3 (grandmother's bday and who baby is named after "Madaline Brilliant".  If previa resolved, plans to take waterbirth class at end of the month.  PTL and bleeding precautions given.

## 2011-11-19 ENCOUNTER — Other Ambulatory Visit: Payer: Self-pay | Admitting: Family

## 2011-11-19 DIAGNOSIS — O4420 Partial placenta previa NOS or without hemorrhage, unspecified trimester: Secondary | ICD-10-CM

## 2011-11-20 ENCOUNTER — Encounter: Payer: Self-pay | Admitting: Family

## 2011-11-20 ENCOUNTER — Ambulatory Visit (HOSPITAL_COMMUNITY)
Admission: RE | Admit: 2011-11-20 | Discharge: 2011-11-20 | Disposition: A | Discharge status: Home / Self Care | Payer: BC Managed Care – PPO | Source: Ambulatory Visit | Attending: Family | Admitting: Family

## 2011-11-20 ENCOUNTER — Other Ambulatory Visit: Payer: Self-pay | Admitting: Family

## 2011-11-20 VITALS — BP 125/72 | HR 87 | Wt 210.5 lb

## 2011-11-20 DIAGNOSIS — D696 Thrombocytopenia, unspecified: Secondary | ICD-10-CM | POA: Insufficient documentation

## 2011-11-20 DIAGNOSIS — O4400 Placenta previa specified as without hemorrhage, unspecified trimester: Secondary | ICD-10-CM | POA: Insufficient documentation

## 2011-11-20 DIAGNOSIS — O4420 Partial placenta previa NOS or without hemorrhage, unspecified trimester: Secondary | ICD-10-CM

## 2011-11-20 DIAGNOSIS — D689 Coagulation defect, unspecified: Secondary | ICD-10-CM | POA: Insufficient documentation

## 2011-11-20 NOTE — Progress Notes (Signed)
Angie Green  was seen today for an ultrasound appointment.  See full report in [facility].  Alpha Gula, MD  Single IUP at 35 6/7 week Interval growth is appropriate (63rd %tile) Normal amniotic fluid volume  TVUS - posterior low lying placents is noted with the leading edge of the placenta 1.1 mm from the internal os  Based on placental location, recommend cesarean delivery at 37-[redacted] weeks gestation due to risk of bleeding.

## 2011-11-21 ENCOUNTER — Ambulatory Visit (INDEPENDENT_AMBULATORY_CARE_PROVIDER_SITE_OTHER): Payer: BC Managed Care – PPO | Admitting: Family

## 2011-11-21 VITALS — BP 126/70 | Wt 207.0 lb

## 2011-11-21 DIAGNOSIS — D696 Thrombocytopenia, unspecified: Secondary | ICD-10-CM

## 2011-11-21 DIAGNOSIS — Z3480 Encounter for supervision of other normal pregnancy, unspecified trimester: Secondary | ICD-10-CM

## 2011-11-21 DIAGNOSIS — D689 Coagulation defect, unspecified: Secondary | ICD-10-CM

## 2011-11-21 NOTE — Progress Notes (Signed)
p-84  Pt needs to be scheduled for C-Section due to previa

## 2011-11-21 NOTE — Progress Notes (Signed)
Pt denies any vaginal bleeding or regular contractions; reviewed ultrasound report, persistent marginal placenta, recommended csection at 37 wks due to risk of bleeding and thrombocytopenia; pt would like to discuss with MD to see if this is necessary; schedule appt for next week for MD visit. Obtain CBC prior to visit (Monday).

## 2011-11-24 ENCOUNTER — Other Ambulatory Visit (INDEPENDENT_AMBULATORY_CARE_PROVIDER_SITE_OTHER): Payer: BC Managed Care – PPO

## 2011-11-24 DIAGNOSIS — D696 Thrombocytopenia, unspecified: Secondary | ICD-10-CM

## 2011-11-24 LAB — CBC
HCT: 33.7 % — ABNORMAL LOW (ref 36.0–46.0)
MCHC: 34.7 g/dL (ref 30.0–36.0)
MCV: 88.2 fL (ref 78.0–100.0)
RDW: 13.1 % (ref 11.5–15.5)

## 2011-11-25 ENCOUNTER — Ambulatory Visit: Payer: BC Managed Care – PPO | Admitting: Obstetrics & Gynecology

## 2011-11-25 ENCOUNTER — Encounter: Payer: Self-pay | Admitting: Family

## 2011-11-25 NOTE — Progress Notes (Signed)
p-94 

## 2011-11-25 NOTE — Progress Notes (Signed)
LLP at 1 cm.  Dr. Claudean Severance MFM recommended c/s at 37-38 weeks.  Friday at 37 weeks is not available, so will schedule on Monday at 9:30.  Pt will be 7:30 a.m.Marland Kitchen  Pt will need 2 units of PRBC in OR due to hemorrhage risk.  Pt also for BTL.  Pt has BCBS and pregnancy medicaid so needs to sign papers, too.

## 2011-11-26 ENCOUNTER — Encounter: Payer: Self-pay | Admitting: *Deleted

## 2011-11-26 ENCOUNTER — Encounter (HOSPITAL_COMMUNITY): Payer: Self-pay | Admitting: Pharmacist

## 2011-11-26 ENCOUNTER — Encounter (HOSPITAL_COMMUNITY): Payer: Self-pay

## 2011-11-27 ENCOUNTER — Encounter (HOSPITAL_COMMUNITY): Payer: Self-pay

## 2011-11-27 ENCOUNTER — Encounter (HOSPITAL_COMMUNITY)
Admission: RE | Admit: 2011-11-27 | Discharge: 2011-11-27 | Disposition: A | Discharge status: Home / Self Care | Payer: BC Managed Care – PPO | Source: Ambulatory Visit | Attending: Obstetrics & Gynecology | Admitting: Obstetrics & Gynecology

## 2011-11-27 VITALS — BP 136/88 | Ht 71.0 in | Wt 210.0 lb

## 2011-11-27 DIAGNOSIS — O4400 Placenta previa specified as without hemorrhage, unspecified trimester: Secondary | ICD-10-CM

## 2011-11-27 HISTORY — DX: Heartburn: R12

## 2011-11-27 HISTORY — DX: Other specified pregnancy related conditions, unspecified trimester: O26.899

## 2011-11-27 HISTORY — DX: Other specified health status: Z78.9

## 2011-11-27 LAB — TYPE AND SCREEN: Antibody Screen: NEGATIVE

## 2011-11-27 LAB — CBC
HCT: 35 % — ABNORMAL LOW (ref 36.0–46.0)
Hemoglobin: 11.9 g/dL — ABNORMAL LOW (ref 12.0–15.0)
MCH: 30.7 pg (ref 26.0–34.0)
MCV: 90.4 fL (ref 78.0–100.0)
RBC: 3.87 MIL/uL (ref 3.87–5.11)
WBC: 11.1 10*3/uL — ABNORMAL HIGH (ref 4.0–10.5)

## 2011-11-27 NOTE — Patient Instructions (Addendum)
Your procedure is scheduled on: 12/01/11  Enter through the Main Entrance at :8am ( 0730 am per doctor) Pick up desk phone and dial [phone] and inform us of your arrival.  Please call [phone] if you have any problems the morning of surgery.  Remember: Do not eat or drink after midnight:Sunday   Take these meds the morning of surgery with a sip of water:none   DO NOT wear jewelry, eye make-up, lipstick,body lotion, or dark fingernail polish. Do not shave for 48 hours prior to surgery.  If you are to be admitted after surgery, leave suitcase in car until your room has been assigned.

## 2011-11-28 ENCOUNTER — Encounter (HOSPITAL_COMMUNITY): Payer: Self-pay

## 2011-12-01 ENCOUNTER — Encounter (HOSPITAL_COMMUNITY): Payer: Self-pay | Admitting: Anesthesiology

## 2011-12-01 ENCOUNTER — Inpatient Hospital Stay (HOSPITAL_COMMUNITY)
Admission: RE | Admit: 2011-12-01 | Discharge: 2011-12-03 | DRG: 650 | Disposition: A | Discharge status: Home / Self Care | Payer: BC Managed Care – PPO | Source: Ambulatory Visit | Attending: Obstetrics & Gynecology | Admitting: Obstetrics & Gynecology

## 2011-12-01 ENCOUNTER — Inpatient Hospital Stay (HOSPITAL_COMMUNITY): Payer: BC Managed Care – PPO | Admitting: Anesthesiology

## 2011-12-01 ENCOUNTER — Encounter (HOSPITAL_COMMUNITY): Payer: Self-pay | Admitting: *Deleted

## 2011-12-01 ENCOUNTER — Encounter (HOSPITAL_COMMUNITY)
Admission: RE | Disposition: A | Discharge status: Home / Self Care | Payer: Self-pay | Source: Ambulatory Visit | Attending: Obstetrics & Gynecology

## 2011-12-01 DIAGNOSIS — D689 Coagulation defect, unspecified: Secondary | ICD-10-CM | POA: Diagnosis present

## 2011-12-01 DIAGNOSIS — O9912 Other diseases of the blood and blood-forming organs and certain disorders involving the immune mechanism complicating childbirth: Secondary | ICD-10-CM

## 2011-12-01 DIAGNOSIS — D696 Thrombocytopenia, unspecified: Secondary | ICD-10-CM | POA: Diagnosis present

## 2011-12-01 DIAGNOSIS — O4400 Placenta previa specified as without hemorrhage, unspecified trimester: Secondary | ICD-10-CM

## 2011-12-01 DIAGNOSIS — O4410 Placenta previa with hemorrhage, unspecified trimester: Principal | ICD-10-CM | POA: Diagnosis present

## 2011-12-01 DIAGNOSIS — Z302 Encounter for sterilization: Secondary | ICD-10-CM

## 2011-12-01 LAB — CBC
HCT: 34.8 % — ABNORMAL LOW (ref 36.0–46.0)
MCH: 30.5 pg (ref 26.0–34.0)
MCHC: 33.6 g/dL (ref 30.0–36.0)
RDW: 13.2 % (ref 11.5–15.5)

## 2011-12-01 LAB — PREPARE RBC (CROSSMATCH)

## 2011-12-01 SURGERY — Surgical Case
Anesthesia: Spinal | Site: Abdomen | Laterality: Bilateral | Wound class: Clean Contaminated

## 2011-12-01 MED ORDER — MORPHINE SULFATE 0.5 MG/ML IJ SOLN
INTRAMUSCULAR | Status: AC
  Filled 2011-12-01: qty 10

## 2011-12-01 MED ORDER — OXYTOCIN 40 UNITS IN LACTATED RINGERS INFUSION - SIMPLE MED
INTRAVENOUS | Status: DC | PRN
  Administered 2011-12-01: 40 [IU] via INTRAVENOUS

## 2011-12-01 MED ORDER — LACTATED RINGERS IV SOLN
INTRAVENOUS | Status: DC
Start: 2011-12-01 — End: 2011-12-01
  Administered 2011-12-01 (×3): via INTRAVENOUS

## 2011-12-01 MED ORDER — MENTHOL 3 MG MT LOZG
1.0000 | LOZENGE | OROMUCOSAL | Status: DC | PRN
  Filled 2011-12-01: qty 9

## 2011-12-01 MED ORDER — OXYCODONE-ACETAMINOPHEN 5-325 MG PO TABS
1.0000 | ORAL_TABLET | ORAL | Status: DC | PRN
  Administered 2011-12-02 – 2011-12-03 (×9): 1 via ORAL
  Filled 2011-12-01 (×9): qty 1

## 2011-12-01 MED ORDER — DIPHENHYDRAMINE HCL 50 MG/ML IJ SOLN: 25.0000 mg | INTRAMUSCULAR | Status: DC | PRN

## 2011-12-01 MED ORDER — SCOPOLAMINE 1 MG/3DAYS TD PT72
MEDICATED_PATCH | TRANSDERMAL | Status: AC
  Filled 2011-12-01: qty 1

## 2011-12-01 MED ORDER — ONDANSETRON HCL 4 MG/2ML IJ SOLN
INTRAMUSCULAR | Status: DC | PRN
  Administered 2011-12-01: 4 mg via INTRAVENOUS

## 2011-12-01 MED ORDER — FENTANYL CITRATE 0.05 MG/ML IJ SOLN
INTRAMUSCULAR | Status: AC
  Administered 2011-12-01: 25 ug via INTRAVENOUS
  Filled 2011-12-01: qty 2

## 2011-12-01 MED ORDER — PHENYLEPHRINE HCL 10 MG/ML IJ SOLN
INTRAMUSCULAR | Status: DC | PRN
  Administered 2011-12-01: 40 ug via INTRAVENOUS
  Administered 2011-12-01: 120 ug via INTRAVENOUS
  Administered 2011-12-01: 40 ug via INTRAVENOUS

## 2011-12-01 MED ORDER — DIBUCAINE 1 % RE OINT
1.0000 "application " | TOPICAL_OINTMENT | RECTAL | Status: DC | PRN
  Filled 2011-12-01: qty 28

## 2011-12-01 MED ORDER — NALBUPHINE SYRINGE 5 MG/0.5 ML
INJECTION | INTRAMUSCULAR | Status: AC
  Filled 2011-12-01: qty 1

## 2011-12-01 MED ORDER — BUPIVACAINE HCL (PF) 0.5 % IJ SOLN
INTRAMUSCULAR | Status: AC
  Filled 2011-12-01: qty 30

## 2011-12-01 MED ORDER — NALBUPHINE HCL 10 MG/ML IJ SOLN
5.0000 mg | INTRAMUSCULAR | Status: DC | PRN
  Administered 2011-12-01: 5 mg via INTRAVENOUS
  Filled 2011-12-01 (×2): qty 1

## 2011-12-01 MED ORDER — SCOPOLAMINE 1 MG/3DAYS TD PT72: 1.0000 | MEDICATED_PATCH | Freq: Once | TRANSDERMAL | Status: DC

## 2011-12-01 MED ORDER — IBUPROFEN 600 MG PO TABS
600.0000 mg | ORAL_TABLET | Freq: Four times a day (QID) | ORAL | Status: DC
  Administered 2011-12-01 – 2011-12-03 (×7): 600 mg via ORAL
  Filled 2011-12-01 (×7): qty 1

## 2011-12-01 MED ORDER — ONDANSETRON HCL 4 MG/2ML IJ SOLN
INTRAMUSCULAR | Status: AC
  Filled 2011-12-01: qty 2

## 2011-12-01 MED ORDER — ONDANSETRON HCL 4 MG/2ML IJ SOLN: 4.0000 mg | Freq: Three times a day (TID) | INTRAMUSCULAR | Status: DC | PRN

## 2011-12-01 MED ORDER — KETOROLAC TROMETHAMINE 30 MG/ML IJ SOLN: 30.0000 mg | Freq: Four times a day (QID) | INTRAMUSCULAR | Status: AC | PRN

## 2011-12-01 MED ORDER — LANOLIN HYDROUS EX OINT: 1.0000 "application " | TOPICAL_OINTMENT | CUTANEOUS | Status: DC | PRN

## 2011-12-01 MED ORDER — MEASLES, MUMPS & RUBELLA VAC ~~LOC~~ INJ
0.5000 mL | INJECTION | Freq: Once | SUBCUTANEOUS | Status: DC
  Filled 2011-12-01: qty 0.5

## 2011-12-01 MED ORDER — EPHEDRINE 5 MG/ML INJ
INTRAVENOUS | Status: AC
  Filled 2011-12-01: qty 10

## 2011-12-01 MED ORDER — KETOROLAC TROMETHAMINE 30 MG/ML IJ SOLN
30.0000 mg | Freq: Four times a day (QID) | INTRAMUSCULAR | Status: AC | PRN
  Administered 2011-12-01: 30 mg via INTRAVENOUS
  Filled 2011-12-01: qty 1

## 2011-12-01 MED ORDER — SODIUM CHLORIDE 0.9 % IJ SOLN: 3.0000 mL | INTRAMUSCULAR | Status: DC | PRN

## 2011-12-01 MED ORDER — FENTANYL CITRATE 0.05 MG/ML IJ SOLN
25.0000 ug | INTRAMUSCULAR | Status: DC | PRN
  Administered 2011-12-01: 25 ug via INTRAVENOUS

## 2011-12-01 MED ORDER — EPHEDRINE SULFATE 50 MG/ML IJ SOLN
INTRAMUSCULAR | Status: DC | PRN
  Administered 2011-12-01 (×3): 5 mg via INTRAVENOUS

## 2011-12-01 MED ORDER — FENTANYL CITRATE 0.05 MG/ML IJ SOLN
INTRAMUSCULAR | Status: AC
  Filled 2011-12-01: qty 2

## 2011-12-01 MED ORDER — FENTANYL CITRATE 0.05 MG/ML IJ SOLN
INTRAMUSCULAR | Status: DC | PRN
  Administered 2011-12-01: 25 ug via INTRATHECAL

## 2011-12-01 MED ORDER — ONDANSETRON HCL 4 MG/2ML IJ SOLN: 4.0000 mg | INTRAMUSCULAR | Status: DC | PRN

## 2011-12-01 MED ORDER — BUPIVACAINE IN DEXTROSE 0.75-8.25 % IT SOLN
INTRATHECAL | Status: DC | PRN
  Administered 2011-12-01: 2 mL via INTRATHECAL

## 2011-12-01 MED ORDER — WITCH HAZEL-GLYCERIN EX PADS: 1.0000 "application " | MEDICATED_PAD | CUTANEOUS | Status: DC | PRN

## 2011-12-01 MED ORDER — DIPHENHYDRAMINE HCL 50 MG/ML IJ SOLN: 12.5000 mg | INTRAMUSCULAR | Status: DC | PRN

## 2011-12-01 MED ORDER — NALBUPHINE HCL 10 MG/ML IJ SOLN
5.0000 mg | INTRAMUSCULAR | Status: DC | PRN
  Administered 2011-12-01: 10 mg via SUBCUTANEOUS
  Filled 2011-12-01: qty 1

## 2011-12-01 MED ORDER — CEFAZOLIN SODIUM-DEXTROSE 2-3 GM-% IV SOLR
2.0000 g | INTRAVENOUS | Status: AC
  Administered 2011-12-01: 2 g via INTRAVENOUS

## 2011-12-01 MED ORDER — LACTATED RINGERS IV SOLN
INTRAVENOUS | Status: DC
  Administered 2011-12-01: 09:00:00 via INTRAVENOUS

## 2011-12-01 MED ORDER — BUPIVACAINE HCL (PF) 0.5 % IJ SOLN
INTRAMUSCULAR | Status: DC | PRN
  Administered 2011-12-01: 10 mL

## 2011-12-01 MED ORDER — TETANUS-DIPHTH-ACELL PERTUSSIS 5-2.5-18.5 LF-MCG/0.5 IM SUSP
0.5000 mL | Freq: Once | INTRAMUSCULAR | Status: DC
  Filled 2011-12-01: qty 0.5

## 2011-12-01 MED ORDER — SIMETHICONE 80 MG PO CHEW: 80.0000 mg | CHEWABLE_TABLET | ORAL | Status: DC | PRN

## 2011-12-01 MED ORDER — MEPERIDINE HCL 25 MG/ML IJ SOLN: 6.2500 mg | INTRAMUSCULAR | Status: DC | PRN

## 2011-12-01 MED ORDER — CEFAZOLIN SODIUM-DEXTROSE 2-3 GM-% IV SOLR
INTRAVENOUS | Status: AC
  Filled 2011-12-01: qty 50

## 2011-12-01 MED ORDER — ONDANSETRON HCL 4 MG PO TABS: 4.0000 mg | ORAL_TABLET | ORAL | Status: DC | PRN

## 2011-12-01 MED ORDER — PRENATAL MULTIVITAMIN CH
1.0000 | ORAL_TABLET | Freq: Every day | ORAL | Status: DC
  Administered 2011-12-02: 1 via ORAL
  Filled 2011-12-01 (×3): qty 1

## 2011-12-01 MED ORDER — METOCLOPRAMIDE HCL 5 MG/ML IJ SOLN: 10.0000 mg | Freq: Three times a day (TID) | INTRAMUSCULAR | Status: DC | PRN

## 2011-12-01 MED ORDER — DIPHENHYDRAMINE HCL 25 MG PO CAPS
25.0000 mg | ORAL_CAPSULE | ORAL | Status: DC | PRN
  Administered 2011-12-02: 25 mg via ORAL
  Filled 2011-12-01 (×2): qty 1

## 2011-12-01 MED ORDER — LACTATED RINGERS IV SOLN
INTRAVENOUS | Status: DC | PRN
  Administered 2011-12-01: 11:00:00 via INTRAVENOUS

## 2011-12-01 MED ORDER — NALOXONE HCL 1 MG/ML IJ SOLN: 1.0000 ug/kg/h | INTRAVENOUS | Status: DC | PRN

## 2011-12-01 MED ORDER — DIPHENHYDRAMINE HCL 25 MG PO CAPS
25.0000 mg | ORAL_CAPSULE | Freq: Four times a day (QID) | ORAL | Status: DC | PRN
  Filled 2011-12-01: qty 1

## 2011-12-01 MED ORDER — PHENYLEPHRINE 40 MCG/ML (10ML) SYRINGE FOR IV PUSH (FOR BLOOD PRESSURE SUPPORT)
PREFILLED_SYRINGE | INTRAVENOUS | Status: AC
  Filled 2011-12-01: qty 5

## 2011-12-01 MED ORDER — ZOLPIDEM TARTRATE 5 MG PO TABS: 5.0000 mg | ORAL_TABLET | Freq: Every evening | ORAL | Status: DC | PRN

## 2011-12-01 MED ORDER — OXYTOCIN 10 UNIT/ML IJ SOLN
INTRAMUSCULAR | Status: AC
  Filled 2011-12-01: qty 4

## 2011-12-01 MED ORDER — NALOXONE HCL 0.4 MG/ML IJ SOLN: 0.4000 mg | INTRAMUSCULAR | Status: DC | PRN

## 2011-12-01 MED ORDER — MORPHINE SULFATE (PF) 0.5 MG/ML IJ SOLN
INTRAMUSCULAR | Status: DC | PRN
  Administered 2011-12-01: .15 mg via INTRATHECAL

## 2011-12-01 MED ORDER — SENNOSIDES-DOCUSATE SODIUM 8.6-50 MG PO TABS
2.0000 | ORAL_TABLET | Freq: Every evening | ORAL | Status: DC | PRN
  Administered 2011-12-01 – 2011-12-02 (×2): 2 via ORAL

## 2011-12-01 MED ORDER — LACTATED RINGERS IV SOLN
INTRAVENOUS | Status: DC
  Administered 2011-12-01: 22:00:00 via INTRAVENOUS

## 2011-12-01 MED ORDER — OXYTOCIN 40 UNITS IN LACTATED RINGERS INFUSION - SIMPLE MED: 62.5000 mL/h | INTRAVENOUS | Status: AC

## 2011-12-01 MED ORDER — SIMETHICONE 80 MG PO CHEW
80.0000 mg | CHEWABLE_TABLET | Freq: Three times a day (TID) | ORAL | Status: DC
  Administered 2011-12-01 – 2011-12-03 (×6): 80 mg via ORAL

## 2011-12-01 SURGICAL SUPPLY — 42 items
BENZOIN TINCTURE PRP APPL 2/3 (GAUZE/BANDAGES/DRESSINGS) ×2 IMPLANT
BINDER ABD UNIV 10 28-50 (GAUZE/BANDAGES/DRESSINGS) IMPLANT
BINDER ABD UNIV 12 45-62 (WOUND CARE) IMPLANT
BINDER ABDOM UNIV 10 (GAUZE/BANDAGES/DRESSINGS)
BINDER ABDOMINAL 46IN 62IN (WOUND CARE)
CLIP FILSHIE TUBAL LIGA STRL (Clip) ×2 IMPLANT
CLOTH BEACON ORANGE TIMEOUT ST (SAFETY) ×2 IMPLANT
DRAPE SURG 17X23 STRL (DRAPES) ×2 IMPLANT
DRESSING TELFA 8X3 (GAUZE/BANDAGES/DRESSINGS) ×4 IMPLANT
DRSG COVADERM 4X10 (GAUZE/BANDAGES/DRESSINGS) ×2 IMPLANT
DURAPREP 26ML APPLICATOR (WOUND CARE) ×2 IMPLANT
ELECT REM PT RETURN 9FT ADLT (ELECTROSURGICAL) ×2
ELECTRODE REM PT RTRN 9FT ADLT (ELECTROSURGICAL) ×1 IMPLANT
EXTRACTOR VACUUM M CUP 4 TUBE (SUCTIONS) IMPLANT
GLOVE BIO SURGEON STRL SZ7 (GLOVE) ×2 IMPLANT
GLOVE BIOGEL PI IND STRL 7.0 (GLOVE) ×5 IMPLANT
GLOVE BIOGEL PI INDICATOR 7.0 (GLOVE) ×5
GLOVE SURG SS PI 7.0 STRL IVOR (GLOVE) ×8 IMPLANT
GOWN PREVENTION PLUS LG XLONG (DISPOSABLE) ×2 IMPLANT
GOWN STRL REIN XL XLG (GOWN DISPOSABLE) ×2 IMPLANT
KIT ABG SYR 3ML LUER SLIP (SYRINGE) IMPLANT
NEEDLE HYPO 22GX1.5 SAFETY (NEEDLE) ×2 IMPLANT
NEEDLE HYPO 25X5/8 SAFETYGLIDE (NEEDLE) IMPLANT
NS IRRIG 1000ML POUR BTL (IV SOLUTION) ×2 IMPLANT
PACK C SECTION WH (CUSTOM PROCEDURE TRAY) ×2 IMPLANT
PAD ABD 7.5X8 STRL (GAUZE/BANDAGES/DRESSINGS) ×4 IMPLANT
PAD OB MATERNITY 4.3X12.25 (PERSONAL CARE ITEMS) IMPLANT
RTRCTR C-SECT PINK 25CM LRG (MISCELLANEOUS) IMPLANT
SLEEVE SCD COMPRESS KNEE MED (MISCELLANEOUS) IMPLANT
SPONGE SURGIFOAM ABS GEL 12-7 (HEMOSTASIS) IMPLANT
STAPLER VISISTAT 35W (STAPLE) IMPLANT
STRIP CLOSURE SKIN 1/2X4 (GAUZE/BANDAGES/DRESSINGS) ×4 IMPLANT
SUT PDS AB 0 CTX 60 (SUTURE) IMPLANT
SUT PLAIN 0 NONE (SUTURE) IMPLANT
SUT SILK 0 TIES 10X30 (SUTURE) IMPLANT
SUT VIC AB 0 CT1 36 (SUTURE) ×6 IMPLANT
SUT VIC AB 3-0 CTX 36 (SUTURE) ×2 IMPLANT
SUT VIC AB 4-0 KS 27 (SUTURE) ×2 IMPLANT
SYR CONTROL 10ML LL (SYRINGE) ×2 IMPLANT
TOWEL OR 17X24 6PK STRL BLUE (TOWEL DISPOSABLE) ×4 IMPLANT
TRAY FOLEY CATH 14FR (SET/KITS/TRAYS/PACK) ×2 IMPLANT
WATER STERILE IRR 1000ML POUR (IV SOLUTION) ×2 IMPLANT

## 2011-12-01 NOTE — Anesthesia Postprocedure Evaluation (Signed)
Anesthesia Post Note  Patient: Angie Green  Procedure(s) Performed: Procedure(s) (LRB): CESAREAN SECTION WITH BILATERAL TUBAL LIGATION (Bilateral)  Anesthesia type: Spinal  Patient location: PACU  Post pain: Pain level controlled  Post assessment: Post-op Vital signs reviewed  Last Vitals:  Filed Vitals:   12/01/11 1230  BP: 110/62  Pulse: 72  Temp: 36.8 C  Resp: 17    Post vital signs: Reviewed  Level of consciousness: awake  Complications: No apparent anesthesia complications

## 2011-12-01 NOTE — H&P (Signed)
Angie Green is a 26 y.o. female presenting for scheduled primary low transverse cesarean section and bilateral tubal ligation. Maternal Medical History:  Reason for admission: Reason for Admission:   nauseaTiffany A Green is a 26 y.o. G3P1011 at [redacted]w[redacted]d with low-lying placenta (posterior, 1 cm from os) and thrombocytopenia presenting for scheduled cesarean section.  Contractions: Onset was 2 days ago.   Frequency: irregular.   Perceived severity is moderate.    Fetal activity: Perceived fetal activity is normal.   Last perceived fetal movement was within the past hour.    Prenatal complications: Placental abnormality (Low-lying, posterior, 1 cm from os) and thrombocytopenia.   Bleeding: early pregnancy.   Prenatal Complications - Diabetes: none.    OB History    Grav Para Term Preterm Abortions TAB SAB Ect Mult Living   3 1 1  1  1   1      Past Medical History  Diagnosis Date  . Vaginal yeast infection     recurrent  . UTI (lower urinary tract infection)     recurrent  . Heartburn in pregnancy   . No pertinent past medical history    Past Surgical History  Procedure Date  . Wisdom tooth extraction   . No past surgeries    Family History: family history includes Cancer in her fathers and maternal grandfather; Diabetes in her maternal grandfather and paternal grandmother; and Heart attack in her maternal grandfather. Social History:  reports that she has never smoked. She has never used smokeless tobacco. She reports that she drinks alcohol. She reports that she does not use illicit drugs.   Prenatal Transfer Tool  Maternal Diabetes: No Genetic Screening: Normal Maternal Ultrasounds/Referrals: Abnormal:  Findings:   Other: Fetal Ultrasounds or other Referrals:  None Maternal Substance Abuse:  No Significant Maternal Medications:  None Significant Maternal Lab Results:  None Other Comments:  None  Review of Systems  Constitutional: Negative for fever and chills.   Eyes: Negative for blurred vision and double vision.  Respiratory: Negative for cough and shortness of breath.   Gastrointestinal: Negative for nausea, vomiting, diarrhea and constipation.  Genitourinary: Negative for dysuria.  Neurological: Negative for dizziness and headaches.      Height 5\' 11"  (1.803 m), weight 96.163 kg (212 lb), last menstrual period 02/12/2011. Maternal Exam:  Abdomen: Patient reports no abdominal tenderness. Fetal presentation: vertex     Physical Exam  Prenatal labs: ABO, Rh: --/--/O POS (11/14 1430) Antibody: NEG (11/14 1430) Rubella: 18.5 (04/15 1603) RPR: NON REACTIVE (11/14 1430)  HBsAg: NEGATIVE (04/15 1603)  HIV: NON REACTIVE (09/13 1009)  GBS:     Assessment/Plan: 26 y.o. G3P1011 at [redacted]w[redacted]d with persistent posterior, low-lying placenta 1 cm from os, thrombocytopenia, and undesired fertility - PLTCS & BTLwith Filshie clips planned - High hemorrhage risk:  2 units RBCs typed and cross-matched to be available in OR - BTL consent signed 11/26/11 and on chart [employer]) - Procedure consent signed and in chart   Napoleon Form 12/01/2011, 5:50 AM

## 2011-12-01 NOTE — Transfer of Care (Signed)
Immediate Anesthesia Transfer of Care Note  Patient: Angie Green  Procedure(s) Performed: Procedure(s) (LRB) with comments: CESAREAN SECTION WITH BILATERAL TUBAL LIGATION (Bilateral) - Primary   Patient Location: PACU  Anesthesia Type:Spinal  Level of Consciousness: awake, alert  and oriented  Airway & Oxygen Therapy: Patient Spontanous Breathing  Post-op Assessment: Report given to PACU RN and Post -op Vital signs reviewed and stable  Post vital signs: Reviewed and stable  Complications: No apparent anesthesia complications

## 2011-12-01 NOTE — Anesthesia Preprocedure Evaluation (Signed)
Anesthesia Evaluation  Patient identified by MRN, date of birth, ID band Patient awake    Reviewed: Allergy & Precautions, H&P , NPO status , Patient's Chart, lab work & pertinent test results  Airway Mallampati: II TM Distance: >3 FB Neck ROM: Full    Dental No notable dental hx. (+) Teeth Intact   Pulmonary neg pulmonary ROS,  breath sounds clear to auscultation  Pulmonary exam normal       Cardiovascular negative cardio ROS  Rhythm:Regular Rate:Normal     Neuro/Psych negative neurological ROS  negative psych ROS   GI/Hepatic Neg liver ROS, GERD-  Medicated and Controlled,  Endo/Other  negative endocrine ROS  Renal/GU negative Renal ROS  negative genitourinary   Musculoskeletal negative musculoskeletal ROS (+)   Abdominal   Peds  Hematology Gestational Thrombocytopenia Stable   Anesthesia Other Findings Hypertrophied pharyngeal tonsils  Reproductive/Obstetrics (+) Pregnancy Previous C/Section Placenta Previa- partial earlier now low lying placenta                           Anesthesia Physical Anesthesia Plan  ASA: II  Anesthesia Plan: Spinal   Post-op Pain Management:    Induction:   Airway Management Planned: Natural Airway  Additional Equipment:   Intra-op Plan:   Post-operative Plan:   Informed Consent: I have reviewed the patients History and Physical, chart, labs and discussed the procedure including the risks, benefits and alternatives for the proposed anesthesia with the patient or authorized representative who has indicated his/her understanding and acceptance.   Dental advisory given  Plan Discussed with: CRNA, Anesthesiologist and Surgeon  Anesthesia Plan Comments:         Anesthesia Quick Evaluation

## 2011-12-01 NOTE — Anesthesia Procedure Notes (Signed)
Spinal  Patient location during procedure: OR Start time: 12/01/2011 10:28 AM Staffing Anesthesiologist: Sherl Yzaguirre A. Performed by: anesthesiologist  Preanesthetic Checklist Completed: patient identified, site marked, surgical consent, pre-op evaluation, timeout performed, IV checked, risks and benefits discussed and monitors and equipment checked Spinal Block Patient position: sitting Prep: site prepped and draped and DuraPrep Patient monitoring: heart rate, cardiac monitor, continuous pulse ox and blood pressure Approach: midline Location: L3-4 Injection technique: single-shot Needle Needle type: Sprotte  Needle gauge: 24 G Needle length: 9 cm Needle insertion depth: 6 cm Assessment Sensory level: T4 Additional Notes Patient tolerated procedure well.

## 2011-12-01 NOTE — Op Note (Signed)
12/01/2011  11:20 AM  PATIENT:  Angie Green  26 y.o. female  PRE-OPERATIVE DIAGNOSIS:  Marginal Placenta; thrombocytopenia; undesired fertility    POST-OPERATIVE DIAGNOSIS:  Marginal Placenta; thrombocytopenia; undesired fertility  PROCEDURE:  Procedure(s) (LRB) with comments: CESAREAN SECTION WITH BILATERAL TUBAL LIGATION (Bilateral) - Primary   SURGEON:  Surgeon(s) and Role:    * Willodean Rosenthal, MD - Primary  ASSISTANTS: none   ANESTHESIA:   spinal  EBL:  Total I/O In: 2000 [I.V.:2000] Out: 750 [Urine:250; Blood:500]  BLOOD ADMINISTERED:none  DRAINS: none   LOCAL MEDICATIONS USED:  MARCAINE     SPECIMEN:  Source of Specimen:  placenta  DISPOSITION OF SPECIMEN:  L&D  COUNTS:  YES  DICTATION: .Note written in EPIC  PLAN OF CARE: Admit to inpatient   PATIENT DISPOSITION:  PACU - hemodynamically stable.   Delay start of Pharmacological VTE agent (>24hrs) due to surgical blood loss or risk of bleeding: yes   INDICATIONS: Angie Green is a 26 y.o. [id number] at [redacted]w[redacted]d here for cesarean section with sterilization secondary to the indications listed under preoperative diagnosis; please see preoperative note for further details.  The risks of cesarean section were discussed with the patient including but were not limited to: bleeding which may require transfusion or reoperation; infection which may require antibiotics; injury to bowel, bladder, ureters or other surrounding organs; injury to the fetus; need for additional procedures including hysterectomy in the event of a life-threatening hemorrhage;incisional problems, thromboembolic phenomenon and other postoperative/anesthesia complications. Reviewed with patient in detail the risks of regret with a tubal sterilization.  Confirmed with the patient that a tubal ligation is meant to be permanent and reviewed the risk of failure as 3-04/998.  Patient confirmed that she does want a permanent sterilization with her  primary c-section. The patient concurred with the proposed plan, giving informed written consent for the procedure.    FINDINGS:  Viable female infant in cephalic presentation.  Apgars 10 and 10.  Clear amniotic fluid.  Intact placenta, three vessel cord.  Nuchal cord x 1. Normal uterus, fallopian tubes and ovaries bilaterally.  PROCEDURE IN DETAIL:  The patient preoperatively received intravenous antibiotics and had sequential compression devices applied to her lower extremities.  She was then taken to the operating room where spinal anesthesia was administered and was found to be adequate. She was then placed in a dorsal supine position with a leftward tilt, and prepped and draped in a sterile manner.  A foley catheter was placed into her bladder and attached to constant gravity.  After an adequate timeout was performed, a Pfannenstiel skin incision was made with scalpel and carried through to the underlying layer of fascia. The fascia was incised in the midline, and this incision was extended bilaterally using the Mayo scissors.  Kocher clamps were applied to the superior aspect of the fascial incision and the underlying rectus muscles were dissected off bluntly. A similar process was carried out on the inferior aspect of the fascial incision. The rectus muscles were separated in the midline bluntly and the peritoneum was entered bluntly. Attention was turned to the lower uterine segment where a low transverse hysterotomy incision was made with a scalpel and extended bilaterally bluntly.  The infant was successfully delivered, the cord was clamped and cut and the infant was handed over to awaiting neonatology team. Uterine massage was then administered, and the placenta delivered intact with a three-vessel cord. The uterus was then cleared of clot and debris.  The hysterotomy was  closed with 0 Vicryl in a running locked fashion, and an imbricating layer was also placed with the same suture. The Fallopian  tubes were identified bilaterally.  A Filshie clip was placed on each tube without difficulty 3 cm from the cornua.  There was no bleeding.  The uterus was returned to the pelvis. The pelvis was cleared of all clot and debris. Hemostasis was confirmed on all surfaces.  The peritoneum and the muscles were reapproximated using 0 Vicryl in 1 interrupted suture. The fascia was then closed using 0 Vicryl in a running fashion.  The subcutaneous layer was irrigated, then reapproximated with 3-0 vicryl in a running fashion, and the skin was closed with a 4-0 Vicryl subcuticular stitch. The incision was injected with 10cc of  0.5% Marcaine. The patient tolerated the procedure well. Sponge, lap, instrument and needle counts were correct x 2.  She was taken to the recovery room awake and in stable condition.

## 2011-12-02 ENCOUNTER — Encounter (HOSPITAL_COMMUNITY): Payer: Self-pay | Admitting: Obstetrics & Gynecology

## 2011-12-02 LAB — CBC
HCT: 29.1 % — ABNORMAL LOW (ref 36.0–46.0)
Hemoglobin: 9.7 g/dL — ABNORMAL LOW (ref 12.0–15.0)
MCV: 90.7 fL (ref 78.0–100.0)
RDW: 13.2 % (ref 11.5–15.5)
WBC: 11.7 10*3/uL — ABNORMAL HIGH (ref 4.0–10.5)

## 2011-12-02 NOTE — Addendum Note (Signed)
Addendum  created 12/02/11 1039 by Suella Grove, CRNA   Modules edited:Notes Section

## 2011-12-02 NOTE — Progress Notes (Signed)
Post-op/Post-partum Day 1  Subjective: no complaints, up ad lib, voiding, tolerating PO and Neg flatus. Breastfeeding going well.  Objective: Blood pressure 117/58, pulse 69, temperature 98 F (36.7 C), temperature source Oral, resp. rate 18, height 5\' 11"  (1.803 m), weight 96.163 kg (212 lb), last menstrual period 02/12/2011, SpO2 98.00%, currently breastfeeding.  Physical Exam:  General: alert, cooperative and no distress Lochia: small Uterine Fundus: firm, @ U, deviated to left Incision: healing well, no significant drainage, no dehiscence, no significant erythema, Steristrips in place DVT Evaluation: Negative Homan's sign. No cords or calf tenderness. Calf/Ankle edema is present. Heart: RRR Legs: CTAB  Basename 12/02/11 0510 12/01/11 0815  HGB 9.7* 11.7*  HCT 29.1* 34.8*    Assessment/Plan: Breastfeeding and Contraception BTL Plan for D/C on POD #3 Encouraged to void Q2 hours.   LOS: 1 day   Dorathy Kinsman 12/02/2011, 5:22 PM

## 2011-12-02 NOTE — Anesthesia Postprocedure Evaluation (Signed)
  Anesthesia Post-op Note  Patient: Angie Green  Procedure(s) Performed: Procedure(s) (LRB) with comments: CESAREAN SECTION WITH BILATERAL TUBAL LIGATION (Bilateral) - Primary   Patient Location: Mother/Baby  Anesthesia Type:Spinal  Level of Consciousness: awake  Airway and Oxygen Therapy: Patient Spontanous Breathing  Post-op Pain: none  Post-op Assessment: Patient's Cardiovascular Status Stable, Respiratory Function Stable, Patent Airway, No signs of Nausea or vomiting, Adequate PO intake, Pain level controlled, No headache, No backache, No residual numbness and No residual motor weakness  Post-op Vital Signs: Reviewed and stable  Complications: No apparent anesthesia complications

## 2011-12-03 LAB — TYPE AND SCREEN
ABO/RH(D): O POS
Antibody Screen: NEGATIVE
Unit division: 0

## 2011-12-03 MED ORDER — FERROUS SULFATE 325 (65 FE) MG PO TABS: 325.0000 mg | ORAL_TABLET | Freq: Every day | ORAL | Status: DC

## 2011-12-03 MED ORDER — IBUPROFEN 600 MG PO TABS: 600.0000 mg | ORAL_TABLET | Freq: Four times a day (QID) | ORAL | Status: AC

## 2011-12-03 MED ORDER — DOCUSATE SODIUM 100 MG PO CAPS: 100.0000 mg | ORAL_CAPSULE | Freq: Two times a day (BID) | ORAL | Status: DC | PRN

## 2011-12-03 MED ORDER — OXYCODONE-ACETAMINOPHEN 5-325 MG PO TABS: 1.0000 | ORAL_TABLET | ORAL | Status: DC | PRN

## 2011-12-03 NOTE — Progress Notes (Cosign Needed)
Subjective: Postpartum Day 2: Cesarean Delivery Patient reports incisional pain, tolerating PO, + flatus and no problems voiding.    Objective: Vital signs in last 24 hours: Temp:  [97.4 F (36.3 C)-98.4 F (36.9 C)] 97.4 F (36.3 C) (11/20 0603) Pulse Rate:  [65-73] 65  (11/20 0603) Resp:  [18-20] 18  (11/20 0603) BP: (103-117)/(58-74) 106/70 mmHg (11/20 0603) SpO2:  [98 %] 98 % (11/19 0900)  Physical Exam:  General: alert, cooperative and no distress Lochia: appropriate Uterine Fundus: firm Incision: healing well DVT Evaluation: No evidence of DVT seen on physical exam.   Basename 12/02/11 0510 12/01/11 0815  HGB 9.7* 11.7*  HCT 29.1* 34.8*    Assessment/Plan: Status post Cesarean section. Doing well postoperatively.  Continue current care. BLT  PILOTO, Javarion Douty 12/03/2011, 7:54 AM

## 2011-12-03 NOTE — Discharge Summary (Signed)
Obstetric Discharge Summary  Angie Green is a 26 y.o. [id number] who presented for scheduled cesarean section at 37w 3d for low-lying placenta.  Reason for Admission: cesarean section Prenatal Procedures: none Intrapartum Procedures: cesarean: low cervical, transverse and tubal ligation Postpartum Procedures: none Complications-Operative and Postpartum: none Hemoglobin  Date Value Range Status  12/02/2011 9.7* 12.0 - 15.0 g/dL Final     DELTA CHECK NOTED     REPEATED TO VERIFY     HCT  Date Value Range Status  12/02/2011 29.1* 36.0 - 46.0 % Final    Physical Exam:  General: alert, cooperative and no distress Lochia: appropriate Uterine Fundus: firm Incision: no significant drainage, no dehiscence, no significant erythema DVT Evaluation: No evidence of DVT seen on physical exam. Negative Homan's sign. No cords or calf tenderness.  Breastfeeding.  Discharge Diagnoses: Term Pregnancy-delivered  Discharge Information: Date: 12/03/2011 Activity: pelvic rest Diet: routine Medications: PNV, Ibuprofen, Colace, Iron and Percocet Condition: stable Instructions: refer to practice specific booklet Discharge to: home Follow-up Information    Follow up with [facility] [place]. Schedule an appointment as soon as possible for a visit in 4 weeks. (As needed if symptoms worsen)    Contact information:   [address] [place] [address] [phone]         Newborn Data: Live born female  Birth Weight: 6 lb 6.7 oz (2910 g) APGAR: 9, 10  Home with mother.  Napoleon Form 12/03/2011, 12:13 PM

## 2011-12-14 DEATH — deceased

## 2011-12-16 ENCOUNTER — Inpatient Hospital Stay (HOSPITAL_COMMUNITY): Admission: AD | Admit: 2011-12-16 | Payer: Self-pay | Source: Ambulatory Visit | Admitting: Obstetrics & Gynecology

## 2012-01-09 ENCOUNTER — Other Ambulatory Visit: Payer: BC Managed Care – PPO | Admitting: Family

## 2012-01-12 ENCOUNTER — Encounter: Payer: Self-pay | Admitting: Advanced Practice Midwife

## 2012-01-12 ENCOUNTER — Ambulatory Visit: Payer: BC Managed Care – PPO | Admitting: Advanced Practice Midwife

## 2012-01-12 NOTE — Progress Notes (Unsigned)
  Subjective:     Angie Green is a 26 y.o. female who presents for a postpartum visit. She is 6 weeks postpartum following a low cervical transverse Cesarean section. I have fully reviewed the prenatal and intrapartum course. The delivery was at 37.3 gestational weeks for placenta previa. Outcome: primary cesarean section, low transverse incision. Anesthesia: spinal. Postpartum course has been normal. Baby's course has been normal except jaundice at 1 week. Baby is feeding by breast and bottle. Bleeding staining only and brown. Bowel function is normal. Bladder function is normal. Patient is not sexually active. Contraception method is tubal ligation. Postpartum depression screening: negative.  The following portions of the patient's history were reviewed and updated as appropriate: allergies, current medications, past family history, past medical history, past social history, past surgical history and problem list.  Review of Systems Pertinent items are noted in HPI.   Objective:    BP 111/67  Pulse 64  Temp 98.6 F (37 C) (Oral)  Resp 16  Ht 5\' 9"  (1.753 m)  Wt 183 lb (83.008 kg)  BMI 27.02 kg/m2  Breastfeeding? Yes  General:  alert, cooperative and no distress   Breasts:  negative  Lungs: clear to auscultation bilaterally  Heart:  regular rate and rhythm, S1, S2 normal, no murmur, click, rub or gallop  Abdomen: soft, non-tender; bowel sounds normal; no masses,  no organomegaly   Vulva:  not evaluated  Vagina: not evaluated  Cervix:  not evalutate  Corpus: not examined  Adnexa:  not evaluated  Rectal Exam: Not performed.        Assessment:    Normal postpartum exam. Pap smear not done at today's visit.   Plan:    1. Contraception: tubal ligation 2. *** 3. Follow up in: {1-10:13787} {time; units:19136} or as needed.

## 2012-02-19 ENCOUNTER — Ambulatory Visit (INDEPENDENT_AMBULATORY_CARE_PROVIDER_SITE_OTHER): Payer: BC Managed Care – PPO | Admitting: Obstetrics & Gynecology

## 2012-02-19 ENCOUNTER — Encounter: Payer: Self-pay | Admitting: Obstetrics & Gynecology

## 2012-02-19 VITALS — BP 115/69 | HR 80 | Temp 98.0°F | Resp 16 | Ht 69.0 in | Wt 187.0 lb

## 2012-02-19 DIAGNOSIS — Z139 Encounter for screening, unspecified: Secondary | ICD-10-CM

## 2012-02-19 DIAGNOSIS — N926 Irregular menstruation, unspecified: Secondary | ICD-10-CM

## 2012-02-19 NOTE — Progress Notes (Signed)
  Subjective:    Patient ID: Angie Green, female    DOB: May 30, 1985, 27 y.o.   MRN: [id number]  HPI  Angie Green is a 27 yo P2 now 3 months s/p C/S and PPS who is here today because of irregular periods and alternating dark brown to bright red blood. She quit pumping breast milk 2 weeks ago.  Review of Systems     Objective:   Physical Exam        Assessment & Plan:  Irregular bleeding likely due to her recent cessation of breast feeding. I will check a TSH and CT/GC testing but I have given her reassurance that her periods should return to normal in the next few months. She declines a flu vaccine today.

## 2012-02-20 LAB — GC/CHLAMYDIA PROBE AMP
CT Probe RNA: NEGATIVE
GC Probe RNA: NEGATIVE

## 2012-02-20 LAB — TSH: TSH: 0.782 u[IU]/mL (ref 0.350–4.500)

## 2012-02-23 ENCOUNTER — Telehealth: Payer: Self-pay | Admitting: *Deleted

## 2012-02-23 NOTE — Telephone Encounter (Signed)
Pt notified via phone of normal test results.

## 2012-05-20 ENCOUNTER — Other Ambulatory Visit: Payer: Self-pay | Admitting: Obstetrics & Gynecology

## 2012-05-20 ENCOUNTER — Ambulatory Visit (INDEPENDENT_AMBULATORY_CARE_PROVIDER_SITE_OTHER): Payer: BC Managed Care – PPO | Admitting: Obstetrics & Gynecology

## 2012-05-20 ENCOUNTER — Encounter: Payer: Self-pay | Admitting: Obstetrics & Gynecology

## 2012-05-20 VITALS — BP 125/71 | HR 81 | Temp 98.2°F | Resp 16 | Ht 69.0 in | Wt 180.0 lb

## 2012-05-20 DIAGNOSIS — R102 Pelvic and perineal pain: Secondary | ICD-10-CM

## 2012-05-20 DIAGNOSIS — N949 Unspecified condition associated with female genital organs and menstrual cycle: Secondary | ICD-10-CM

## 2012-05-20 DIAGNOSIS — N390 Urinary tract infection, site not specified: Secondary | ICD-10-CM

## 2012-05-20 DIAGNOSIS — N644 Mastodynia: Secondary | ICD-10-CM

## 2012-05-20 LAB — POCT URINALYSIS DIPSTICK
Glucose, UA: NEGATIVE
Nitrite, UA: NEGATIVE
Protein, UA: NEGATIVE
Spec Grav, UA: 1.02
Urobilinogen, UA: NEGATIVE

## 2012-05-20 NOTE — Progress Notes (Signed)
  Subjective:     Angie Green is a 27 y.o. female who presents for evaluation of abdominal pain. The pain is described as aching and sharp, and is 5/10 in intensity. Pain is located in the RLQ area without radiation. Onset was sudden occurring 3 weeks ago. Symptoms have been unchanged since. Aggravating factors: only occurs during nad after sexual intercourse. Alleviating factors: none. Associated symptoms: none. The patient denies constipation, diarrhea, flatus, myalgias, nausea and vomiting. Risk factors for pelvic/abdominal pain include prior cesarean section almost 6 months ago and BTL.   Pt also complaining of right breast pain for several weeks.  Pt stopped brest feeding 3 months ago.  No nipple discharge.  No skin changes   Menstrual History: OB History   Grav Para Term Preterm Abortions TAB SAB Ect Mult Living   3 2 2  1  1   2        Patient's last menstrual period was 05/04/2012.    The following portions of the patient's history were reviewed and updated as appropriate: allergies, current medications, past family history, past medical history, past social history, past surgical history and problem list.   Review of Systems Pertinent items are noted in HPI.    Objective:    BP 125/71  Pulse 81  Temp(Src) 98.2 F (36.8 C) (Oral)  Resp 16  Ht 5\' 9"  (1.753 m)  Wt 180 lb (81.647 kg)  BMI 26.57 kg/m2  LMP 05/04/2012  Breastfeeding? No General:   alert, cooperative and no distress  Lungs:   non labored  Heart:   not examined  Abdomen:  soft, non-tender; bowel sounds normal; no masses,  no organomegaly  CVA:   absent  Pelvis:  Vulva and vagina appear normal. Bimanual exam reveals normal uterus and adnexa.  Extremities:   no edema, redness or tenderness in the calves or thighs  Neurologic:   negative  Psychiatric:   normal mood, behavior, speech, dress, and thought processes    Breast:  No mass, but pain with breast exam on right breasat at 10 o'clock leading to  tail of breast.  Lab Review Labs: none   Imaging Ultrasound - Pelvic Vaginal, Ultrasound - Pelvic Abdominal, right breast US      Assessment:    Questionable breast cyst; questionable ovarian cyst    Plan:    The diagnosis was discussed with the patient and evaluation and treatment plans outlined. Follow up as needed.  Will call pt with results of pelvic US

## 2012-05-20 NOTE — Patient Instructions (Signed)
  Place pelvic pain patient instructions here.  

## 2012-05-24 ENCOUNTER — Ambulatory Visit (INDEPENDENT_AMBULATORY_CARE_PROVIDER_SITE_OTHER): Payer: BC Managed Care – PPO

## 2012-05-24 DIAGNOSIS — R9389 Abnormal findings on diagnostic imaging of other specified body structures: Secondary | ICD-10-CM

## 2012-05-24 DIAGNOSIS — R102 Pelvic and perineal pain: Secondary | ICD-10-CM

## 2012-05-24 DIAGNOSIS — N949 Unspecified condition associated with female genital organs and menstrual cycle: Secondary | ICD-10-CM

## 2012-05-27 ENCOUNTER — Ambulatory Visit
Admission: RE | Admit: 2012-05-27 | Discharge: 2012-05-27 | Disposition: A | Discharge status: Home / Self Care | Payer: BC Managed Care – PPO | Source: Ambulatory Visit | Attending: Obstetrics & Gynecology | Admitting: Obstetrics & Gynecology

## 2012-05-27 DIAGNOSIS — N644 Mastodynia: Secondary | ICD-10-CM

## 2012-06-01 ENCOUNTER — Telehealth: Payer: Self-pay | Admitting: *Deleted

## 2012-06-01 NOTE — Telephone Encounter (Signed)
LM on voicemail that her pelvic u/s and breast u/s were both normal.

## 2012-06-01 NOTE — Telephone Encounter (Signed)
Message copied by Granville Lewis on Tue Jun 01, 2012  8:55 AM ------      Message from: Lesly Dukes      Created: Mon May 31, 2012  5:57 PM       Adnexa are clear.  Retroverted uterus.  RN Chestine Spore to call pts with results. ------

## 2012-10-12 ENCOUNTER — Ambulatory Visit: Payer: BC Managed Care – PPO | Admitting: *Deleted

## 2012-10-12 VITALS — Ht 69.0 in

## 2012-10-12 DIAGNOSIS — R102 Pelvic and perineal pain: Secondary | ICD-10-CM

## 2012-10-19 ENCOUNTER — Encounter: Payer: Self-pay | Admitting: Obstetrics & Gynecology

## 2012-10-19 ENCOUNTER — Ambulatory Visit (INDEPENDENT_AMBULATORY_CARE_PROVIDER_SITE_OTHER): Payer: BC Managed Care – PPO | Admitting: Obstetrics & Gynecology

## 2012-10-19 VITALS — BP 112/75 | HR 74 | Resp 16 | Ht 69.0 in | Wt 181.0 lb

## 2012-10-19 DIAGNOSIS — N946 Dysmenorrhea, unspecified: Secondary | ICD-10-CM

## 2012-10-19 MED ORDER — NORGESTIM-ETH ESTRAD TRIPHASIC 0.18/0.215/0.25 MG-35 MCG PO TABS: 1.0000 | ORAL_TABLET | Freq: Every day | ORAL | Status: DC

## 2012-10-19 NOTE — Progress Notes (Signed)
  Subjective:    Patient ID: Angie Green, female    DOB: [birth year], 27 y.o.   MRN: [id number]  HPI  Pt presents for continued pelvic pain, cycles occuring every 20 days, pain during intercourse.  Pt had no problem when she was on the birth control pill prior to pregnancy.  Pt is amendable to resuming pill.  Pt had nml [place] in May 2014.  Pt has nml BM and no issues with urination.  Review of Systems  Constitutional: Negative.   Gastrointestinal: Negative.   Genitourinary: Positive for vaginal bleeding, menstrual problem and pelvic pain.  Musculoskeletal: Negative.        Objective:   Physical Exam  Vitals reviewed. Constitutional: She appears well-developed and well-nourished. No distress.  HENT:  Head: Normocephalic and atraumatic.  Eyes: Conjunctivae are normal.  Pulmonary/Chest: Effort normal.  Abdominal: Soft. She exhibits no distension. There is no tenderness. There is no rebound.  Genitourinary: Vagina normal and uterus normal.  Minimal pain over bladder.  No pain on bimanual.  No masses noted.  Musculoskeletal: She exhibits no edema.  Skin: Skin is warm and dry.  Psychiatric: She has a normal mood and affect.      Assessment & Plan:  27 yo female with dysmenorrhea and dyspareunia.  1-Nml [place]. 2-Nml exam 3-Tri Sprintec 4-RTC 4 months.

## 2012-10-27 ENCOUNTER — Ambulatory Visit: Payer: BC Managed Care – PPO | Admitting: Obstetrics & Gynecology

## 2013-03-29 DIAGNOSIS — L409 Psoriasis, unspecified: Secondary | ICD-10-CM | POA: Insufficient documentation

## 2013-03-29 DIAGNOSIS — J351 Hypertrophy of tonsils: Secondary | ICD-10-CM | POA: Insufficient documentation

## 2013-04-20 ENCOUNTER — Ambulatory Visit (INDEPENDENT_AMBULATORY_CARE_PROVIDER_SITE_OTHER): Payer: BC Managed Care – PPO | Admitting: Family Medicine

## 2013-04-20 ENCOUNTER — Encounter: Payer: Self-pay | Admitting: Family Medicine

## 2013-04-20 VITALS — BP 114/77 | HR 70 | Resp 16 | Ht 70.0 in | Wt 177.0 lb

## 2013-04-20 DIAGNOSIS — R309 Painful micturition, unspecified: Secondary | ICD-10-CM

## 2013-04-20 DIAGNOSIS — R300 Dysuria: Secondary | ICD-10-CM

## 2013-04-20 DIAGNOSIS — N898 Other specified noninflammatory disorders of vagina: Secondary | ICD-10-CM

## 2013-04-20 LAB — POCT URINALYSIS DIPSTICK
BILIRUBIN UA: NEGATIVE
Glucose, UA: NEGATIVE
Ketones, UA: NEGATIVE
Leukocytes, UA: NEGATIVE
Nitrite, UA: NEGATIVE
PH UA: 6.5
PROTEIN UA: NEGATIVE
RBC UA: NEGATIVE
SPEC GRAV UA: 1.01
Urobilinogen, UA: 0.2

## 2013-04-20 MED ORDER — METRONIDAZOLE 500 MG PO TABS
500.0000 mg | ORAL_TABLET | Freq: Two times a day (BID) | ORAL | Status: DC
Start: 2013-04-20 — End: 2014-01-23

## 2013-04-20 NOTE — Progress Notes (Signed)
    Subjective:    Patient ID: Angie Green is a 28 y.o. female presenting with Vaginitis  on 04/20/2013  HPI: Having discharge.  Not like yeast.  Clear with yellow, no odor. Vaginal itching and irritation since 2 wks.  LMP was 3/31. No new sexual partner.  Review of Systems  Constitutional: Negative for fever and chills.  Respiratory: Negative for shortness of breath.   Cardiovascular: Negative for chest pain.  Gastrointestinal: Negative for nausea, vomiting and abdominal pain.  Genitourinary: Negative for dysuria.  Skin: Negative for rash.      Objective:    BP 114/77  Pulse 70  Resp 16  Ht 5\' 10"  (1.778 m)  Wt 177 lb (80.287 kg)  BMI 25.40 kg/m2  LMP 04/13/2013  Breastfeeding? No Physical Exam  Constitutional: Angie Green is oriented to person, place, and time. Angie Green appears well-developed and well-nourished. No distress.  HENT:  Head: Normocephalic and atraumatic.  Eyes: No scleral icterus.  Neck: Neck supple.  Cardiovascular: Normal rate.   Pulmonary/Chest: Effort normal.  Abdominal: Soft.  Genitourinary: Uterus is not enlarged and not tender. Cervix exhibits no motion tenderness and no discharge. Right adnexum displays no mass and no tenderness. Left adnexum displays mass. Left adnexum displays no tenderness. Vaginal discharge (thin white) found.  Neurological: Angie Green is alert and oriented to person, place, and time.  Skin: Skin is warm and dry.  Psychiatric: Angie Green has a normal mood and affect.        Assessment & Plan:   Painful urination - Plan: POCT Urinalysis Dipstick  Vaginal discharge - Plan: Wet prep, genital Presumptive treatment of BV with Flagyl  Return if symptoms worsen or fail to improve.

## 2013-04-20 NOTE — Patient Instructions (Signed)
Vaginitis Vaginitis is an inflammation of the vagina. It is most often caused by a change in the normal balance of the bacteria and yeast that live in the vagina. This change in balance causes an overgrowth of certain bacteria or yeast, which causes the inflammation. There are different types of vaginitis, but the most common types are:  Bacterial vaginosis.  Yeast infection (candidiasis).  Trichomoniasis vaginitis. This is a sexually transmitted infection (STI).  Viral vaginitis.  Atropic vaginitis.  Allergic vaginitis. CAUSES  The cause depends on the type of vaginitis. Vaginitis can be caused by:  Bacteria (bacterial vaginosis).  Yeast (yeast infection).  A parasite (trichomoniasis vaginitis)  A virus (viral vaginitis).  Low hormone levels (atrophic vaginitis). Low hormone levels can occur during pregnancy, breastfeeding, or after menopause.  Irritants, such as bubble baths, scented tampons, and feminine sprays (allergic vaginitis). Other factors can change the normal balance of the yeast and bacteria that live in the vagina. These include:  Antibiotic medicines.  Poor hygiene.  Diaphragms, vaginal sponges, spermicides, birth control pills, and intrauterine devices (IUD).  Sexual intercourse.  Infection.  Uncontrolled diabetes.  A weakened immune system. SYMPTOMS  Symptoms can vary depending on the cause of the vaginitis. Common symptoms include:  Abnormal vaginal discharge.  The discharge is white, gray, or yellow with bacterial vaginosis.  The discharge is thick, white, and cheesy with a yeast infection.  The discharge is frothy and yellow or greenish with trichomoniasis.  A bad vaginal odor.  The odor is fishy with bacterial vaginosis.  Vaginal itching, pain, or swelling.  Painful intercourse.  Pain or burning when urinating. Sometimes, there are no symptoms. TREATMENT  Treatment will vary depending on the type of infection.   Bacterial  vaginosis and trichomoniasis are often treated with antibiotic creams or pills.  Yeast infections are often treated with antifungal medicines, such as vaginal creams or suppositories.  Viral vaginitis has no cure, but symptoms can be treated with medicines that relieve discomfort. Your sexual partner should be treated as well.  Atrophic vaginitis may be treated with an estrogen cream, pill, suppository, or vaginal ring. If vaginal dryness occurs, lubricants and moisturizing creams may help. You may be told to avoid scented soaps, sprays, or douches.  Allergic vaginitis treatment involves quitting the use of the product that is causing the problem. Vaginal creams can be used to treat the symptoms. HOME CARE INSTRUCTIONS   Take all medicines as directed by your caregiver.  Keep your genital area clean and dry. Avoid soap and only rinse the area with water.  Avoid douching. It can remove the healthy bacteria in the vagina.  Do not use tampons or have sexual intercourse until your vaginitis has been treated. Use sanitary pads while you have vaginitis.  Wipe from front to back. This avoids the spread of bacteria from the rectum to the vagina.  Let air reach your genital area.  Wear cotton underwear to decrease moisture buildup.  Avoid wearing underwear while you sleep until your vaginitis is gone.  Avoid tight pants and underwear or nylons without a cotton panel.  Take off wet clothing (especially bathing suits) as soon as possible.  Use mild, non-scented products. Avoid using irritants, such as:  Scented feminine sprays.  Fabric softeners.  Scented detergents.  Scented tampons.  Scented soaps or bubble baths.  Practice safe sex and use condoms. Condoms may prevent the spread of trichomoniasis and viral vaginitis. SEEK MEDICAL CARE IF:   You have abdominal pain.  You   have a fever or persistent symptoms for more than 2 3 days.  You have a fever and your symptoms suddenly  get worse. Document Released: 10/27/2006 Document Revised: 09/24/2011 Document Reviewed: 06/12/2011 [employer] Patient Information 2014 [employer].  

## 2013-04-21 ENCOUNTER — Telehealth: Payer: Self-pay | Admitting: *Deleted

## 2013-04-21 DIAGNOSIS — B3731 Acute candidiasis of vulva and vagina: Secondary | ICD-10-CM

## 2013-04-21 DIAGNOSIS — B373 Candidiasis of vulva and vagina: Secondary | ICD-10-CM

## 2013-04-21 LAB — WET PREP, GENITAL
Clue Cells Wet Prep HPF POC: NONE SEEN
Trich, Wet Prep: NONE SEEN

## 2013-04-21 MED ORDER — FLUCONAZOLE 150 MG PO TABS: ORAL_TABLET | ORAL | Status: DC

## 2013-04-21 NOTE — Telephone Encounter (Signed)
Message copied by Granville LewisLARK, Shareta Fishbaugh L on Thu Apr 21, 2013 11:02 AM ------      Message from: Reva BoresPRATT, TANYA S      Created: Thu Apr 21, 2013 10:33 AM       Wet prep shows yeast--we treated for BV--if not improving may need to switch to Diflucan. ------

## 2013-08-20 IMAGING — US US TRANSVAGINAL NON-OB
1 series · 13 of 25 positions shown · non-contrast
Comparison: OB ultrasounds 06/10/2011, 07/21/2011, 09/03/2011,
09/29/2011, 10/23/2011.

CLINICAL DATA: Right pelvic pain, especially after intercourse.
Cesarean section 6 months ago.  History of postpartum bleeding.

TRANSABDOMINAL AND TRANSVAGINAL ULTRASOUND OF PELVIS
TECHNIQUE: Both transabdominal and transvaginal ultrasound
examinations of the pelvis were performed. Transabdominal technique
was performed for global imaging of the pelvis including uterus,
ovaries, adnexal regions, and pelvic cul-de-sac.
It was necessary to proceed with endovaginal exam following the
transabdominal exam to visualize the endometrium, ovaries, uterus,
and adnexa..

[Series 1: us transvaginal non-ob · 0.32mm/px · 57 acquisitions, 13 frames shown]
[im 1/57]
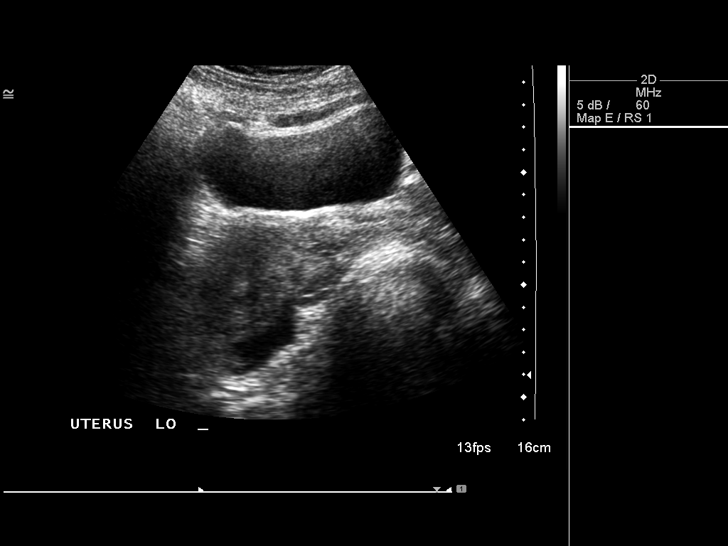
[im 5/57]
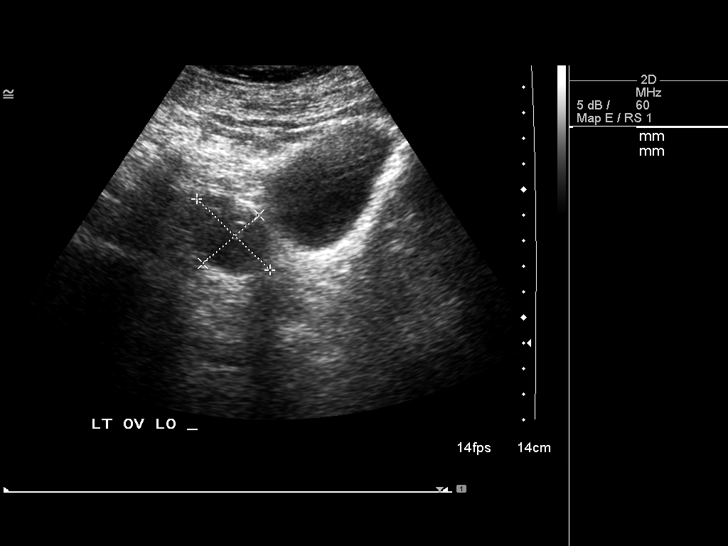
[im 10/57]
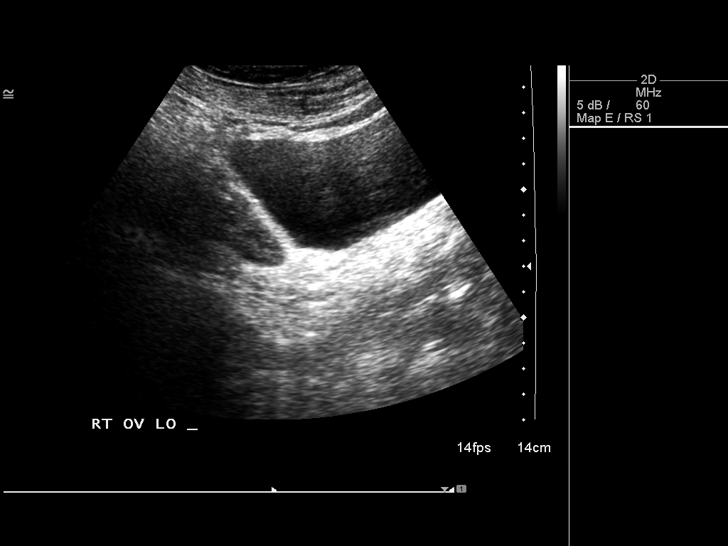
[im 15/57]
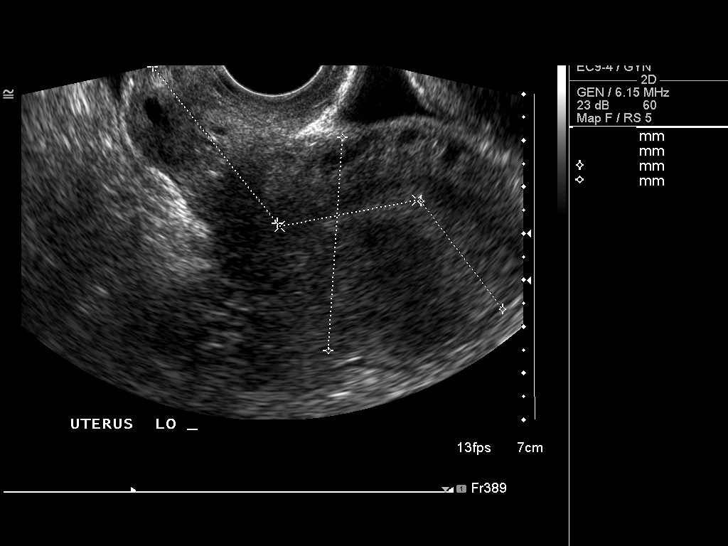
[im 19/57]
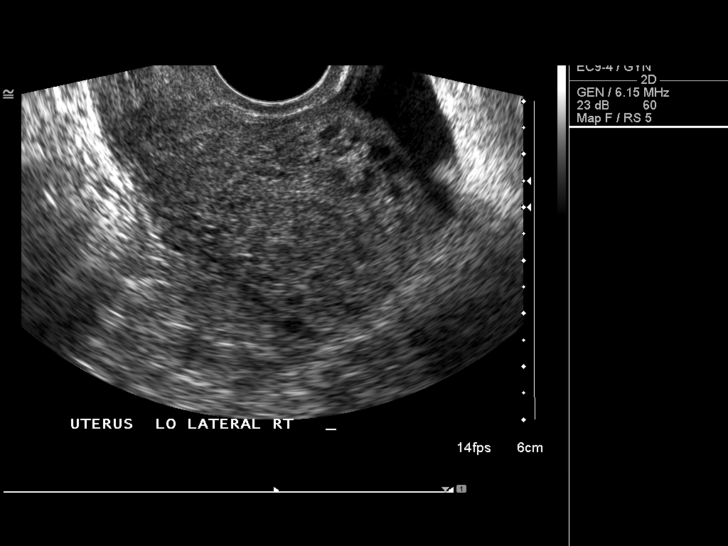
[im 24/57]
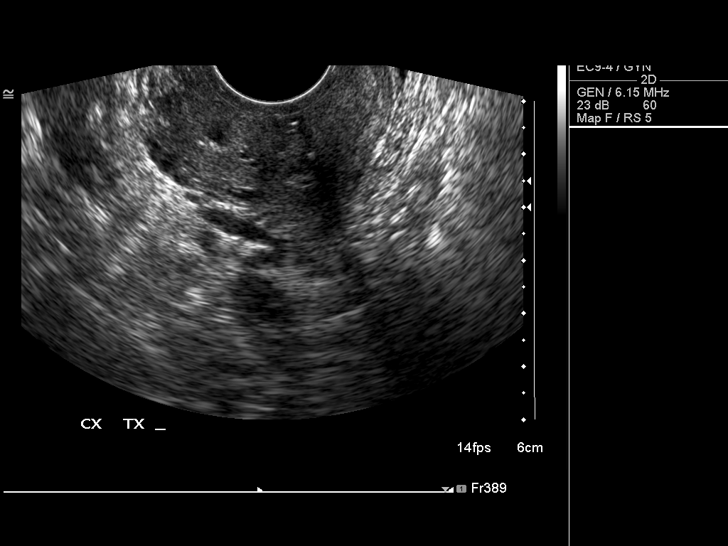
[im 29/57]
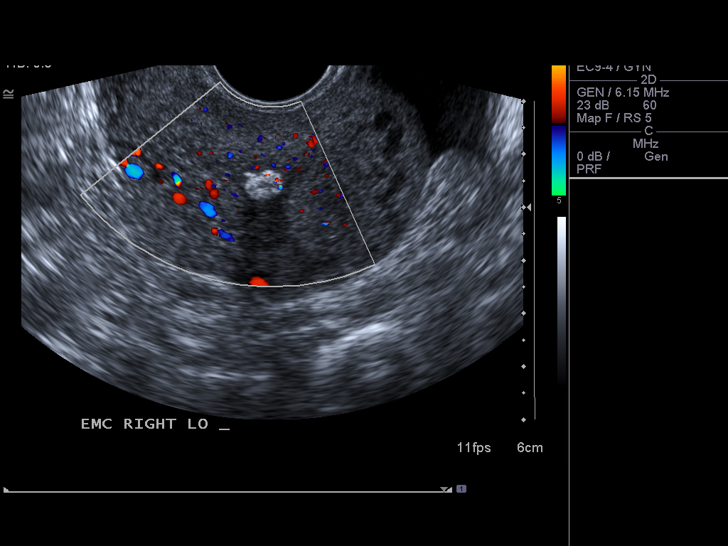
[im 33/57]
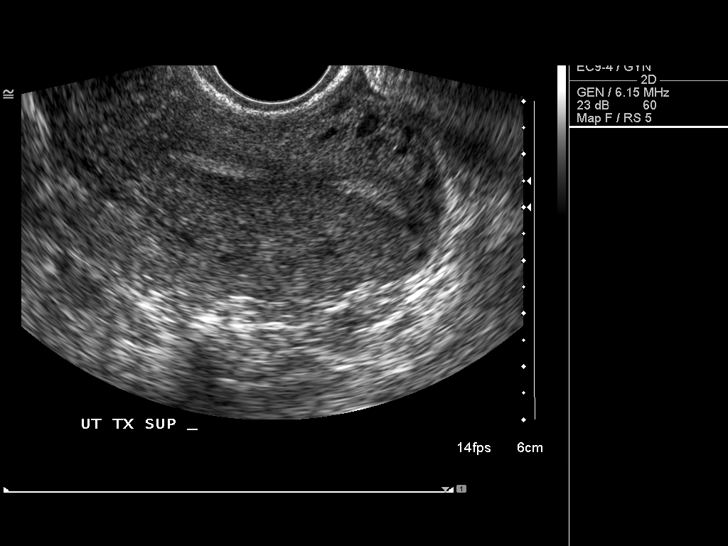
[im 38/57]
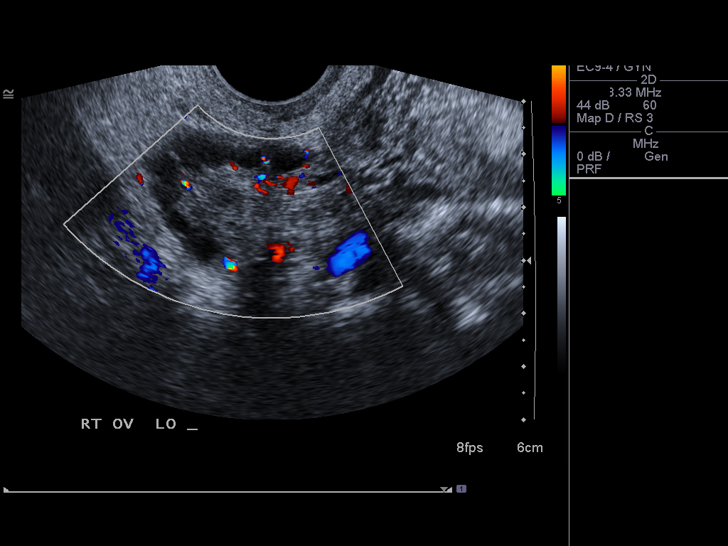
[im 43/57]
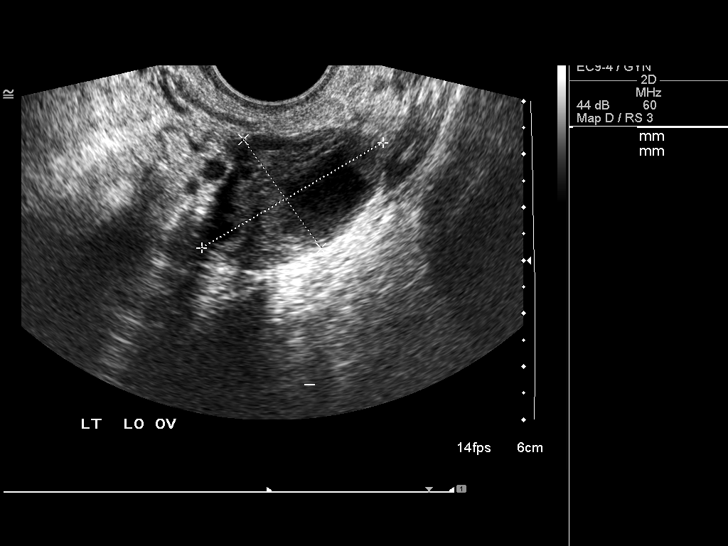
[im 47/57]
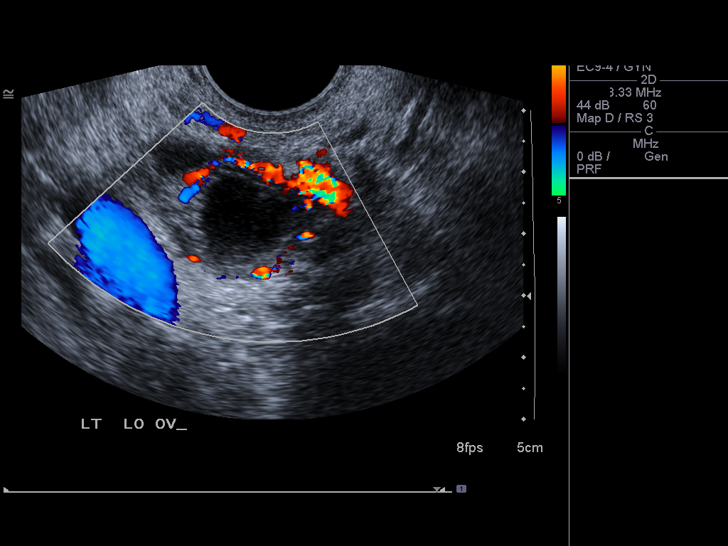
[im 52/57]
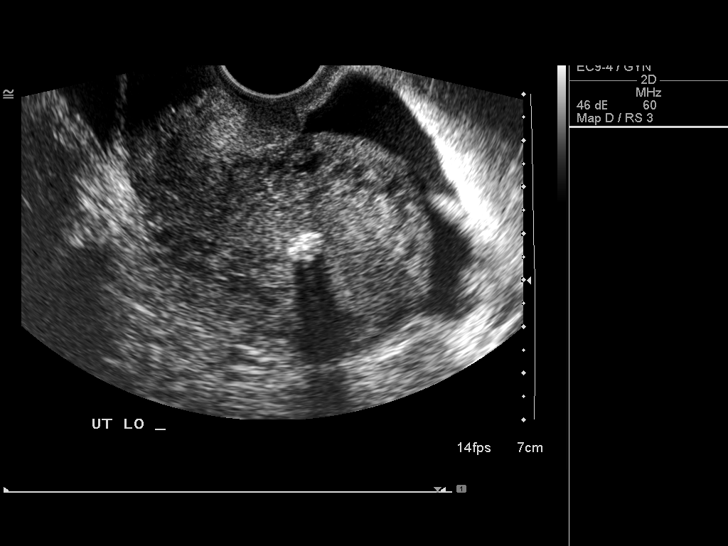
[im 57/57]
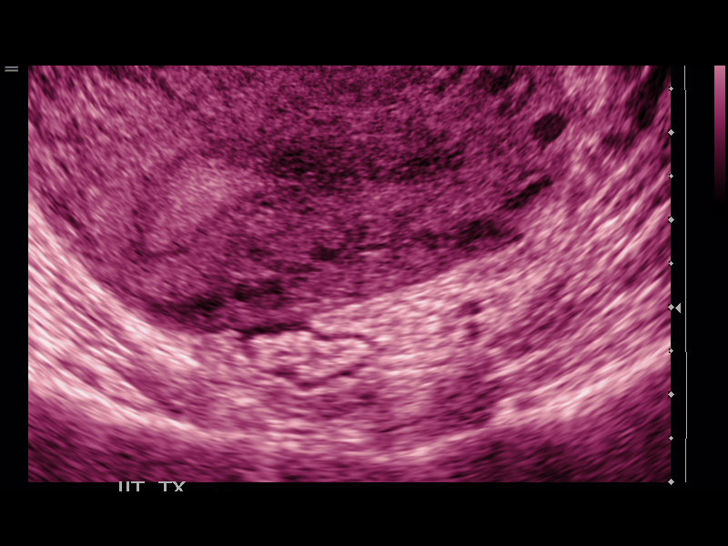

[13 of 25 positions shown; findings below may reference images not displayed]

FINDINGS: Uterus: The uterus is retroverted and retroflexed and measures
x 5.2 x 6.2 cm. No focal uterine mass is identified.

Endometrium: The endometrium measures approximately 10 mm maximal
thickness.  In the right aspect of the endometrial canal is a focal
8 x 6 x 7 mm area of increased echogenicity and posterior acoustic
shadowing.  This likely reflects a focal endometrial calcification.
There may be a small amount of vascular flow within this
echogenicity.

Right ovary:  Normal appearance/no adnexal mass.  Measures 3.3 x
3.2 x 2.5 cm.

Left ovary: Normal appearance/no adnexal mass.  Measures 4.0 x
x 3.3 cm.

Other findings: There is a trace a small amount of free fluid in
the pelvis.
IMPRESSION: 1.  Retroflexed retroverted uterus.
2.  Normal endometrial thickness.
3.  8 x 6 x 7 mm focal echogenicity with posterior acoustic
shadowing in the right aspect of the endometrial canal.  This
likely reflects focal endometrial calcification.  If there is
clinical concern for retained products of conception, calcification
within chronic retained products of conception cannot be excluded.
4.  Normal ovaries.

## 2013-11-14 ENCOUNTER — Encounter: Payer: Self-pay | Admitting: Family Medicine

## 2014-01-23 ENCOUNTER — Ambulatory Visit (INDEPENDENT_AMBULATORY_CARE_PROVIDER_SITE_OTHER): Payer: BLUE CROSS/BLUE SHIELD | Admitting: Obstetrics & Gynecology

## 2014-01-23 ENCOUNTER — Encounter: Payer: Self-pay | Admitting: Obstetrics & Gynecology

## 2014-01-23 VITALS — BP 113/69 | HR 84 | Resp 16 | Wt 183.0 lb

## 2014-01-23 DIAGNOSIS — R102 Pelvic and perineal pain: Principal | ICD-10-CM

## 2014-01-23 DIAGNOSIS — N949 Unspecified condition associated with female genital organs and menstrual cycle: Secondary | ICD-10-CM

## 2014-01-23 DIAGNOSIS — G8929 Other chronic pain: Secondary | ICD-10-CM | POA: Diagnosis not present

## 2014-01-23 DIAGNOSIS — N3010 Interstitial cystitis (chronic) without hematuria: Secondary | ICD-10-CM | POA: Diagnosis not present

## 2014-01-23 MED ORDER — NORGESTIMATE-ETH ESTRADIOL 0.25-35 MG-MCG PO TABS: 1.0000 | ORAL_TABLET | Freq: Every day | ORAL | Status: AC

## 2014-01-23 NOTE — Progress Notes (Signed)
   Subjective:    Patient ID: Angie Green, female    DOB: 1985-07-01, 29 y.o.   MRN: [id number]  HPI  Pt is having worsening pelvic pain.  Pt never took OCPs from 2014.  Pain wasn't bad until recently.  Pain is about 90% of the time.  Pain with intercourse and pain with first day of menses.  Pt does have IC.  Denies constipation.  Pt uses BTL for birth control.  Pt wary of starting OCPs but wants to feel better and is ready to start.  Pt also c/o breast pain in bilateral outer upper quadrants for past few days.  No nipple discharge or mass.  No history of breast cancer in her family.    Review of Systems As above    Objective:   Physical Exam  Constitutional: She is oriented to person, place, and time. She appears well-developed and well-nourished. No distress.  HENT:  Head: Normocephalic and atraumatic.  Pulmonary/Chest: Effort normal.  Abdominal: Soft. She exhibits no distension and no mass. There is no tenderness. There is no rebound and no guarding.  Genitourinary: Vagina normal and uterus normal.  Uterus anteverted Bilateral adnexa nml, non tender, no masses Mild pain over bladder No pain over levators  Musculoskeletal: She exhibits no edema.  Neurological: She is alert and oriented to person, place, and time.  Skin: Skin is warm and dry.  Psychiatric: She has a normal mood and affect.  Vitals reviewed.  Filed Vitals:   01/23/14 1422  BP: 113/69  Pulse: 84  Resp: 16  Weight: 183 lb (83.008 kg)           Assessment & Plan:  29 yo female with left sided pelvic pain  1-? Endometriosis with dysmenorrhea.  Could be exacerbation of IC. Start OCPs.  Given instructions.  If pain continues can get US and/or use continuous OCPs 2-F/U with urologist for IC 3-Pap due in April 2016.
